# Patient Record
Sex: Female | Born: 1935 | Race: White | Hispanic: No | Smoking: Never smoker
Health system: Southern US, Community
[De-identification: ages and names within clinical notes are randomized; demographics above are authoritative.]

## PROBLEM LIST (undated history)

## (undated) ENCOUNTER — Emergency Department (HOSPITAL_BASED_OUTPATIENT_CLINIC_OR_DEPARTMENT_OTHER): Discharge status: Against Medical Advice | Payer: BC Managed Care – PPO

## (undated) DIAGNOSIS — K219 Gastro-esophageal reflux disease without esophagitis: Secondary | ICD-10-CM

## (undated) DIAGNOSIS — H269 Unspecified cataract: Secondary | ICD-10-CM

## (undated) DIAGNOSIS — I341 Nonrheumatic mitral (valve) prolapse: Secondary | ICD-10-CM

## (undated) DIAGNOSIS — J479 Bronchiectasis, uncomplicated: Secondary | ICD-10-CM

## (undated) DIAGNOSIS — N12 Tubulo-interstitial nephritis, not specified as acute or chronic: Secondary | ICD-10-CM

## (undated) DIAGNOSIS — I10 Essential (primary) hypertension: Secondary | ICD-10-CM

## (undated) DIAGNOSIS — R7611 Nonspecific reaction to tuberculin skin test without active tuberculosis: Secondary | ICD-10-CM

## (undated) DIAGNOSIS — E079 Disorder of thyroid, unspecified: Secondary | ICD-10-CM

## (undated) DIAGNOSIS — M5090 Cervical disc disorder, unspecified, unspecified cervical region: Secondary | ICD-10-CM

## (undated) HISTORY — PX: PARTIAL HYSTERECTOMY: SHX80

## (undated) HISTORY — PX: TONSILLECTOMY AND ADENOIDECTOMY: SUR1326

## (undated) HISTORY — DX: Essential (primary) hypertension: I10

## (undated) HISTORY — DX: Disorder of thyroid, unspecified: E07.9

## (undated) HISTORY — DX: Nonspecific reaction to tuberculin skin test without active tuberculosis: R76.11

## (undated) HISTORY — PX: EYE SURGERY: SHX253

## (undated) HISTORY — PX: CHOLECYSTECTOMY: SHX55

## (undated) HISTORY — DX: Nonrheumatic mitral (valve) prolapse: I34.1

## (undated) HISTORY — PX: OTHER SURGICAL HISTORY: SHX169

## (undated) HISTORY — DX: Unspecified cataract: H26.9

## (undated) HISTORY — PX: ABDOMINAL HYSTERECTOMY: SHX81

## (undated) HISTORY — DX: Gastro-esophageal reflux disease without esophagitis: K21.9

## (undated) HISTORY — DX: Cervical disc disorder, unspecified, unspecified cervical region: M50.90

## (undated) HISTORY — DX: Bronchiectasis, uncomplicated: J47.9

## (undated) HISTORY — DX: Tubulo-interstitial nephritis, not specified as acute or chronic: N12

---

## 1999-02-25 LAB — HM PAP SMEAR: HM Pap smear: NORMAL

## 1999-10-26 ENCOUNTER — Ambulatory Visit (HOSPITAL_COMMUNITY): Admission: RE | Admit: 1999-10-26 | Discharge: 1999-10-26 | Payer: Self-pay | Admitting: Specialist

## 2000-06-01 ENCOUNTER — Ambulatory Visit (HOSPITAL_COMMUNITY): Admission: RE | Admit: 2000-06-01 | Discharge: 2000-06-01 | Payer: Self-pay | Admitting: Specialist

## 2000-09-19 ENCOUNTER — Encounter: Admission: RE | Admit: 2000-09-19 | Discharge: 2000-09-19 | Payer: Self-pay | Admitting: Family Medicine

## 2000-09-19 ENCOUNTER — Encounter: Payer: Self-pay | Admitting: Family Medicine

## 2001-09-26 ENCOUNTER — Encounter: Payer: Self-pay | Admitting: Family Medicine

## 2001-09-26 ENCOUNTER — Encounter: Admission: RE | Admit: 2001-09-26 | Discharge: 2001-09-26 | Payer: Self-pay | Admitting: Family Medicine

## 2001-11-08 ENCOUNTER — Emergency Department (HOSPITAL_COMMUNITY): Admission: EM | Admit: 2001-11-08 | Discharge: 2001-11-08 | Payer: Self-pay

## 2001-11-08 ENCOUNTER — Encounter: Payer: Self-pay | Admitting: Emergency Medicine

## 2002-11-14 ENCOUNTER — Encounter: Payer: Self-pay | Admitting: Family Medicine

## 2002-11-14 ENCOUNTER — Encounter: Admission: RE | Admit: 2002-11-14 | Discharge: 2002-11-14 | Payer: Self-pay | Admitting: Family Medicine

## 2003-08-07 ENCOUNTER — Ambulatory Visit (HOSPITAL_COMMUNITY): Admission: RE | Admit: 2003-08-07 | Discharge: 2003-08-07 | Payer: Self-pay | Admitting: Gastroenterology

## 2003-08-07 ENCOUNTER — Encounter (INDEPENDENT_AMBULATORY_CARE_PROVIDER_SITE_OTHER): Payer: Self-pay | Admitting: Specialist

## 2004-03-24 ENCOUNTER — Encounter: Admission: RE | Admit: 2004-03-24 | Discharge: 2004-03-24 | Payer: Self-pay | Admitting: Emergency Medicine

## 2005-02-01 ENCOUNTER — Emergency Department (HOSPITAL_COMMUNITY): Admission: EM | Admit: 2005-02-01 | Discharge: 2005-02-01 | Payer: Self-pay | Admitting: Emergency Medicine

## 2005-04-07 ENCOUNTER — Encounter: Admission: RE | Admit: 2005-04-07 | Discharge: 2005-04-07 | Payer: Self-pay | Admitting: Emergency Medicine

## 2006-01-09 ENCOUNTER — Ambulatory Visit (HOSPITAL_COMMUNITY): Admission: RE | Admit: 2006-01-09 | Discharge: 2006-01-09 | Payer: Self-pay | Admitting: Emergency Medicine

## 2006-04-14 ENCOUNTER — Encounter: Admission: RE | Admit: 2006-04-14 | Discharge: 2006-04-14 | Payer: Self-pay | Admitting: Emergency Medicine

## 2007-04-17 ENCOUNTER — Encounter: Admission: RE | Admit: 2007-04-17 | Discharge: 2007-04-17 | Payer: Self-pay | Admitting: Emergency Medicine

## 2008-01-05 ENCOUNTER — Encounter: Admission: RE | Admit: 2008-01-05 | Discharge: 2008-01-05 | Payer: Self-pay | Admitting: Emergency Medicine

## 2008-01-08 ENCOUNTER — Ambulatory Visit: Payer: Self-pay | Admitting: Vascular Surgery

## 2008-05-14 ENCOUNTER — Encounter: Admission: RE | Admit: 2008-05-14 | Discharge: 2008-05-14 | Payer: Self-pay | Admitting: Emergency Medicine

## 2011-03-17 ENCOUNTER — Other Ambulatory Visit: Payer: Self-pay | Admitting: Emergency Medicine

## 2011-03-30 ENCOUNTER — Ambulatory Visit
Admission: RE | Admit: 2011-03-30 | Discharge: 2011-03-30 | Disposition: A | Discharge status: Home / Self Care | Payer: Medicare Other | Source: Ambulatory Visit | Attending: Emergency Medicine | Admitting: Emergency Medicine

## 2011-05-11 NOTE — Procedures (Signed)
CAROTID DUPLEX EXAM   INDICATION:  Bilateral arm numbness.   HISTORY:  Diabetes:  No.  Cardiac:  No.  Hypertension:  Yes.  Smoking:  No.  Previous Surgery:  No.  CV History:  No.  Amaurosis Fugax:  No, Paresthesias:  No, Hemiparesis:  No                                       RIGHT             LEFT  Brachial systolic pressure:         121               120  Brachial Doppler waveforms:         Biphasic          Biphasic  Vertebral direction of flow:        Antegrade         Antegrade  DUPLEX VELOCITIES (cm/sec)  CCA peak systolic                   67                38  ECA peak systolic                   114               81  ICA peak systolic                   84                91  ICA end diastolic                   27                26  PLAQUE MORPHOLOGY:                  Heterogeneous     Heterogeneous  PLAQUE AMOUNT:                      Mild              Mild  PLAQUE LOCATION:                    ICA and ECA       ICA and ECA   IMPRESSION:  1% to 39% stenosis noted in bilateral internal carotid  arteries bilaterally.  Antegrade bilateral vertebral arteries.   ___________________________________________  Janetta Hora Fields, MD   MG/MEDQ  D:  01/08/2008  T:  01/09/2008  Job:  [id number]

## 2011-05-14 NOTE — Op Note (Signed)
[place]. [facility]  Patient:    Shelly Morse, Shelly Morse                     MRN: [id number] Proc. Date: 06/22/00 Adm. Date:  [id number] Disc. Date: [id number] Attending:  Mick Sell                           Operative Report  PREOPERATIVE DIAGNOSIS:  Cataract, right eye.  POSTOPERATIVE DIAGNOSIS:  Cataract, right eye.  OPERATION PERFORMED:  Cataract extraction with intraocular lens implant, right eye.  SURGEON:  Chucky May, M.D.  INDICATIONS FOR SURGERY:  The patient is a 77 year old female with painless progressive decrease in vision so that she has difficulty seeing for her day to day activities, especially reading.  On examination the patient was found to have a dense nuclear sclerotic and cortical cataract consistent with the decrease in visual acuity.  DESCRIPTION OF PROCEDURE:  The patient was brought to the main operating room and placed in supine position.  Anesthesia was obtained by means of topical 4% lidocaine drops with tetracaine.  The patient was then prepped and draped in the usual manner.  A lid speculum was inserted and the cornea was entered with a diamond keratome superiorly with an additional port temporally.  OcuCoat was instilled and an anterior capsulorrhexis was performed without difficulty. The nucleus was mobilized by hydrodissection followed by phacoemulsification of the nucleus and removal of residual cortical material by irrigation and aspiration.  The posterior capsule was polished and a posterior chamber lens implant was placed in the bag without difficulty.  OcuCoat was removed and replaced with balanced salt solution.  The wound was hydrated with balanced salt solution and checked for fluid leaks but none were noted.  The eye was dressed with topical Pred Forte, Ocuflox, Voltaren and a Fox shield and the patient was taken to the recovery room in excellent condition where she received written and verbal instructions  for her postoperative care and was scheduled for follow up in 24 hours. DD:  06/22/00 TD:  06/23/00 Job: [id number]

## 2011-05-14 NOTE — Op Note (Signed)
   NAME:  Shelly Morse, Shelly Morse                        ACCOUNT NO.:  [id number]   MEDICAL RECORD NO.:  [id number]                   PATIENT TYPE:  AMB   LOCATION:  ENDO                                 FACILITY:  [facility]   PHYSICIAN:  Graylin Shiver, M.D.                DATE OF BIRTH:  May 18, 1922   DATE OF PROCEDURE:  08/07/2003  DATE OF DISCHARGE:                                 OPERATIVE REPORT   PROCEDURE:  Colonoscopy with polypectomy.   INDICATION FOR PROCEDURE:  Screening.  Family history of colon cancer in the  patient's half-brother, intermittent rectal bleeding.   Informed consent was obtained after explanation of the risks of bleeding,  infection, and perforation.   PREMEDICATIONS:  1. Fentanyl 37.5 mcg IV.  2. Versed 4 mg IV.   DESCRIPTION OF PROCEDURE:  With the patient in the left lateral decubitus  position, a rectal exam was performed, and no masses were felt.  The Olympus  colonoscope was inserted into the rectum and advanced around the colon to  the cecum.  The colon was quite tortuous up around the region of the hepatic  flexure, requiring abdominal pressure and changing of the patient's  position.  The cecal landmarks were identified.  The cecum looked normal.  In the proximal ascending colon, there was a 5 mm sessile polyps which was  snared with the mini-snare and removed by snare cautery technique.  The  polyp was retrieved.  The transverse colon looked normal.  The descending  colon, sigmoid, and rectum looked normal.  She tolerated the procedure well  without complications.   IMPRESSION:  Small polyp in the ascending colon.   PLAN:  The pathology will be checked.                                               Graylin Shiver, M.D.    SFG/MEDQ  D:  08/07/2003  T:  08/07/2003  Job:  [id number]   cc:   Brett Canales A. Cleta Alberts, M.D.  [address]  [place]  [place] [address]  Fax: [phone]

## 2011-06-27 DIAGNOSIS — N12 Tubulo-interstitial nephritis, not specified as acute or chronic: Secondary | ICD-10-CM

## 2011-06-27 HISTORY — DX: Tubulo-interstitial nephritis, not specified as acute or chronic: N12

## 2011-07-12 LAB — HEPATIC FUNCTION PANEL
ALT: 26 U/L (ref 7–35)
AST: 35 U/L (ref 13–35)
Bilirubin, Total: 1.3 mg/dL

## 2011-07-12 LAB — BASIC METABOLIC PANEL
BUN: 21 mg/dL (ref 4–21)
Creatinine: 0.9 mg/dL (ref <=1.1)
Glucose: 111 mg/dL
Potassium: 4.3 mmol/L (ref 3.4–5.3)

## 2012-01-10 ENCOUNTER — Encounter: Payer: Self-pay | Admitting: *Deleted

## 2012-01-10 DIAGNOSIS — K219 Gastro-esophageal reflux disease without esophagitis: Secondary | ICD-10-CM | POA: Insufficient documentation

## 2012-01-10 DIAGNOSIS — I10 Essential (primary) hypertension: Secondary | ICD-10-CM | POA: Insufficient documentation

## 2012-02-01 ENCOUNTER — Encounter: Payer: Self-pay | Admitting: Emergency Medicine

## 2012-02-01 ENCOUNTER — Ambulatory Visit (INDEPENDENT_AMBULATORY_CARE_PROVIDER_SITE_OTHER): Payer: Medicare Other | Admitting: Emergency Medicine

## 2012-02-01 VITALS — BP 138/71 | HR 66 | Temp 97.6°F | Resp 20 | Ht 64.0 in | Wt 139.8 lb

## 2012-02-01 DIAGNOSIS — I10 Essential (primary) hypertension: Secondary | ICD-10-CM

## 2012-02-01 DIAGNOSIS — Z0000 Encounter for general adult medical examination without abnormal findings: Secondary | ICD-10-CM

## 2012-02-01 NOTE — Progress Notes (Signed)
  Subjective:    Patient ID: Shelly Morse, female    DOB: 03-Oct-1922, 78 y.o.   MRN: [id number]  HPI patient enters for general checkup she is actually doing very well she is concerned because her systolic pressure is high but her diastolic is low. She denies any orthostatic symptoms chest pain or shortness of breath she states she's not and should have a mammogram colonoscopy or other invasive diagnostic    Review of Systems no real complaints she is primarily concerned about blood pressure readings.     Objective:   Physical Exam HE EENT is normal chest is clear heart is regular rate and rhythm abdomen soft nontender        Assessment & Plan:  Patient is doing well she is currently on Diovan and has well-controlled blood pressure no change in her treatment plan at present

## 2012-02-01 NOTE — Patient Instructions (Signed)
Plantar Fasciitis (Heel Spur Syndrome) with Rehab The plantar fascia is a fibrous, ligament-like, soft-tissue structure that spans the bottom of the foot. Plantar fasciitis is a condition that causes pain in the foot due to inflammation of the tissue. SYMPTOMS    Pain and tenderness on the underneath side of the foot.     Pain that worsens with standing or walking.  CAUSES   Plantar fasciitis is caused by irritation and injury to the plantar fascia on the underneath side of the foot. Common mechanisms of injury include:  Direct trauma to bottom of the foot.     Damage to a small nerve that runs under the foot where the main fascia attaches to the heel bone.     Stress placed on the plantar fascia due to bone spurs.  RISK INCREASES WITH:    Activities that place stress on the plantar fascia (running, jumping, pivoting, or cutting).     Poor strength and flexibility.     Improperly fitted shoes.     Tight calf muscles.     Flat feet.     Failure to warm-up properly before activity.     Obesity.  PREVENTION  Warm up and stretch properly before activity.     Allow for adequate recovery between workouts.     Maintain physical fitness:     Strength, flexibility, and endurance.     Cardiovascular fitness.     Maintain a health body weight.     Avoid stress on the plantar fascia.     Wear properly fitted shoes, including arch supports for individuals who have flat feet.  PROGNOSIS   If treated properly, then the symptoms of plantar fasciitis usually resolve without surgery. However, occasionally surgery is necessary. RELATED COMPLICATIONS    Recurrent symptoms that may result in a chronic condition.     Problems of the lower back that are caused by compensating for the injury, such as limping.     Pain or weakness of the foot during push-off following surgery.     Chronic inflammation, scarring, and partial or complete fascia tear, occurring more often from repeated  injections.  TREATMENT   Treatment initially involves the use of ice and medication to help reduce pain and inflammation. The use of strengthening and stretching exercises may help reduce pain with activity, especially stretches of the Achilles tendon. These exercises may be performed at home or with a therapist. Your caregiver may recommend that you use heel cups of arch supports to help reduce stress on the plantar fascia. Occasionally, corticosteroid injections are given to reduce inflammation. If symptoms persist for greater than 6 months despite non-surgical (conservative), then surgery may be recommended.   MEDICATION    If pain medication is necessary, then nonsteroidal anti-inflammatory medications, such as aspirin and ibuprofen, or other minor pain relievers, such as acetaminophen, are often recommended.     Do not take pain medication within 7 days before surgery.     Prescription pain relievers may be given if deemed necessary by your caregiver. Use only as directed and only as much as you need.     Corticosteroid injections may be given by your caregiver. These injections should be reserved for the most serious cases, because they may only be given a certain number of times.  HEAT AND COLD  Cold treatment (icing) relieves pain and reduces inflammation. Cold treatment should be applied for 10 to 15 minutes every 2 to 3 hours for inflammation and pain and immediately   after any activity that aggravates your symptoms. Use ice packs or massage the area with a piece of ice (ice massage).     Heat treatment may be used prior to performing the stretching and strengthening activities prescribed by your caregiver, physical therapist, or athletic trainer. Use a heat pack or soak the injury in warm water.  SEEK IMMEDIATE MEDICAL CARE IF:  Treatment seems to offer no benefit, or the condition worsens.     Any medications produce adverse side effects.  EXERCISES RANGE OF MOTION (ROM) AND  STRETCHING EXERCISES - Plantar Fasciitis (Heel Spur Syndrome) These exercises may help you when beginning to rehabilitate your injury. Your symptoms may resolve with or without further involvement from your physician, physical therapist or athletic trainer. While completing these exercises, remember:    Restoring tissue flexibility helps normal motion to return to the joints. This allows healthier, less painful movement and activity.     An effective stretch should be held for at least 30 seconds.     A stretch should never be painful. You should only feel a gentle lengthening or release in the stretched tissue.  RANGE OF MOTION - Toe Extension, Flexion  Sit with your right / left leg crossed over your opposite knee.     Grasp your toes and gently pull them back toward the top of your foot. You should feel a stretch on the bottom of your toes and/or foot.     Hold this stretch for __________ seconds.     Now, gently pull your toes toward the bottom of your foot. You should feel a stretch on the top of your toes and or foot.     Hold this stretch for __________ seconds.  Repeat __________ times. Complete this stretch __________ times per day.   RANGE OF MOTION - Ankle Dorsiflexion, Active Assisted  Remove shoes and sit on a chair that is preferably not on a carpeted surface.     Place right / left foot under knee. Extend your opposite leg for support.     Keeping your heel down, slide your right / left foot back toward the chair until you feel a stretch at your ankle or calf. If you do not feel a stretch, slide your bottom forward to the edge of the chair, while still keeping your heel down.     Hold this stretch for __________ seconds.  Repeat __________ times. Complete this stretch __________ times per day.   STRETCH - Gastroc, Standing  Place hands on wall.     Extend right / left leg, keeping the front knee somewhat bent.     Slightly point your toes inward on your back foot.      Keeping your right / left heel on the floor and your knee straight, shift your weight toward the wall, not allowing your back to arch.     You should feel a gentle stretch in the right / left calf. Hold this position for __________ seconds.  Repeat __________ times. Complete this stretch __________ times per day. STRETCH - Soleus, Standing  Place hands on wall.     Extend right / left leg, keeping the other knee somewhat bent.     Slightly point your toes inward on your back foot.     Keep your right / left heel on the floor, bend your back knee, and slightly shift your weight over the back leg so that you feel a gentle stretch deep in your back calf.       Hold this position for __________ seconds.  Repeat __________ times. Complete this stretch __________ times per day. STRETCH - Gastrocsoleus, Standing  Note: This exercise can place a lot of stress on your foot and ankle. Please complete this exercise only if specifically instructed by your caregiver.    Place the ball of your right / left foot on a step, keeping your other foot firmly on the same step.     Hold on to the wall or a rail for balance.     Slowly lift your other foot, allowing your body weight to press your heel down over the edge of the step.     You should feel a stretch in your right / left calf.     Hold this position for __________ seconds.     Repeat this exercise with a slight bend in your right / left knee.  Repeat __________ times. Complete this stretch __________ times per day.   STRENGTHENING EXERCISES - Plantar Fasciitis (Heel Spur Syndrome)  These exercises may help you when beginning to rehabilitate your injury. They may resolve your symptoms with or without further involvement from your physician, physical therapist or athletic trainer. While completing these exercises, remember:    Muscles can gain both the endurance and the strength needed for everyday activities through controlled exercises.       Complete these exercises as instructed by your physician, physical therapist or athletic trainer. Progress the resistance and repetitions only as guided.  STRENGTH - Towel Curls  Sit in a chair positioned on a non-carpeted surface.     Place your foot on a towel, keeping your heel on the floor.     Pull the towel toward your heel by only curling your toes. Keep your heel on the floor.     If instructed by your physician, physical therapist or athletic trainer, add ____________________ at the end of the towel.  Repeat __________ times. Complete this exercise __________ times per day. STRENGTH - Ankle Inversion  Secure one end of a rubber exercise band/tubing to a fixed object (table, pole). Loop the other end around your foot just before your toes.     Place your fists between your knees. This will focus your strengthening at your ankle.     Slowly, pull your big toe up and in, making sure the band/tubing is positioned to resist the entire motion.     Hold this position for __________ seconds.     Have your muscles resist the band/tubing as it slowly pulls your foot back to the starting position.  Repeat __________ times. Complete this exercises __________ times per day.   Document Released: 12/13/2005 Document Revised: 08/25/2011 Document Reviewed: 03/27/2009 [employer] Patient Information 2012 [employer]. 

## 2012-02-02 LAB — POCT URINALYSIS DIPSTICK
Bilirubin, UA: NEGATIVE
Glucose, UA: NEGATIVE
Ketones, UA: NEGATIVE
Nitrite, UA: NEGATIVE
Urobilinogen, UA: 0.2

## 2012-02-02 LAB — CBC WITH DIFFERENTIAL/PLATELET
Basophils Absolute: 0 10*3/uL (ref 0.0–0.1)
Lymphocytes Relative: 27 % (ref 12–46)
Lymphs Abs: 2.7 10*3/uL (ref 0.7–4.0)
MCH: 32.5 pg (ref 26.0–34.0)
MCHC: 32.7 g/dL (ref 30.0–36.0)
Monocytes Absolute: 0.5 10*3/uL (ref 0.1–1.0)
Neutrophils Relative %: 68 % (ref 43–77)
WBC: 10 10*3/uL (ref 4.0–10.5)

## 2012-02-02 LAB — COMPREHENSIVE METABOLIC PANEL
AST: 29 U/L (ref 0–37)
Glucose, Bld: 81 mg/dL (ref 70–99)
Potassium: 4.3 mEq/L (ref 3.5–5.3)

## 2012-02-02 NOTE — Progress Notes (Signed)
Addended by: Lesle Chris A on: 02/02/2012 10:44 AM   Modules accepted: Orders

## 2012-02-07 ENCOUNTER — Telehealth: Payer: Self-pay

## 2012-02-07 NOTE — Telephone Encounter (Signed)
.  [facility] PT HAD LABS DONE AND WAS TOLD SHE WOULD GET A CALL. PLEASE CALL [phone]

## 2012-02-08 NOTE — Telephone Encounter (Signed)
Please call Shelly Morse and let her now her labs are okay. Her TSH is slightly elevated but still within normal range. We will recheck it again in about 6 months. Please mail her a copy of her labs.

## 2012-02-08 NOTE — Telephone Encounter (Signed)
SPOKE WITH PT GAVE RESULTS FROM DR DAUB. LABS MAILED TO PT

## 2012-02-15 ENCOUNTER — Ambulatory Visit (INDEPENDENT_AMBULATORY_CARE_PROVIDER_SITE_OTHER): Payer: Medicare Other | Admitting: Internal Medicine

## 2012-02-15 VITALS — BP 160/76 | HR 76 | Temp 97.7°F | Resp 18 | Ht 63.0 in | Wt 143.0 lb

## 2012-02-15 DIAGNOSIS — H1010 Acute atopic conjunctivitis, unspecified eye: Secondary | ICD-10-CM

## 2012-02-15 DIAGNOSIS — H10219 Acute toxic conjunctivitis, unspecified eye: Secondary | ICD-10-CM

## 2012-02-15 DIAGNOSIS — Z77098 Contact with and (suspected) exposure to other hazardous, chiefly nonmedicinal, chemicals: Secondary | ICD-10-CM

## 2012-02-15 NOTE — Progress Notes (Signed)
  Subjective:    Patient ID: Shelly Morse, female    DOB: Apr 01, 1922, 78 y.o.   MRN: [id number]  HPI used [employer] to clean under kitchen cabinet on 02/04/12 and having persistent scratchy throat, irritated eyes, feels thirsty and a little foggy eaded. Used protective clothing eye goggles, latex gloves, work Cytogeneticist.cleaned under the dishwasher and now using the dishwasher and still smells the product and feels irritation of her mucous membranes. No cough no sob not dizzy    Review of Systems  Constitutional: Negative.   HENT: Negative.   Eyes: Positive for itching.  Respiratory: Negative.   Cardiovascular: Negative.   Gastrointestinal: Negative.   Genitourinary: Negative.   Musculoskeletal: Negative.   Neurological: Positive for light-headedness.  Hematological: Negative.   Psychiatric/Behavioral: Negative.   All other systems reviewed and are negative.       Objective:   Physical Exam  Constitutional: She is oriented to person, place, and time. She appears well-developed and well-nourished.  HENT:  Head: Normocephalic and atraumatic.  Eyes: Conjunctivae and EOM are normal. Pupils are equal, round, and reactive to light.  Neck: Normal range of motion. Neck supple.  Cardiovascular: Normal rate and regular rhythm.   Pulmonary/Chest: Effort normal and breath sounds normal.  Abdominal: Soft.  Musculoskeletal: Normal range of motion.  Neurological: She is alert and oriented to person, place, and time.  Skin: Skin is warm and dry.  Psychiatric: She has a normal mood and affect. Her behavior is normal. Judgment and thought content normal.          Assessment & Plan:  chemical exposure Chemical sensitivity

## 2012-02-15 NOTE — Progress Notes (Signed)
  Subjective:    Patient ID: Shelly Morse, female    DOB: August 22, 1922, 78 y.o.   MRN: [id number]  HPI    Review of Systems     Objective:   Physical Exam        Assessment & Plan:  o2 sat is normal

## 2012-02-15 NOTE — Patient Instructions (Signed)
Increase fluids. Increase aeration of the environment. If your symptoms worsen return to have further evaluation.

## 2012-02-27 ENCOUNTER — Other Ambulatory Visit: Payer: Self-pay | Admitting: Emergency Medicine

## 2012-04-16 ENCOUNTER — Other Ambulatory Visit: Payer: Self-pay | Admitting: Physician Assistant

## 2012-06-21 ENCOUNTER — Other Ambulatory Visit: Payer: Self-pay | Admitting: Internal Medicine

## 2012-07-21 ENCOUNTER — Other Ambulatory Visit: Payer: Self-pay | Admitting: Physician Assistant

## 2012-09-14 ENCOUNTER — Other Ambulatory Visit: Payer: Self-pay | Admitting: Physician Assistant

## 2012-10-02 ENCOUNTER — Other Ambulatory Visit: Payer: Self-pay | Admitting: Physician Assistant

## 2012-11-09 ENCOUNTER — Telehealth: Payer: Self-pay

## 2012-11-09 NOTE — Telephone Encounter (Signed)
Spoke with pt, she thinks it is a side effect from her medication. She is on Diovan but wants to take something with a lower dose and generic. Can you advise Dr Cleta Alberts? She would like you to advise on this. She really would like a generic because her money is limited.

## 2012-11-09 NOTE — Telephone Encounter (Signed)
Pt says she is having terrible leg cramps and would like a nurse to call her as soon as possible [phone]

## 2012-11-09 NOTE — Telephone Encounter (Signed)
Called her , last OV was Feb she needs to come in for legs cramping.

## 2012-11-10 MED ORDER — LOSARTAN POTASSIUM 50 MG PO TABS: 50.0000 mg | ORAL_TABLET | Freq: Every day | ORAL | Status: DC

## 2012-11-10 NOTE — Telephone Encounter (Signed)
Called patient advised new Rx sent to [facility] [address]. Patient will call back and let us know what her BP readings are.

## 2012-11-10 NOTE — Telephone Encounter (Signed)
Please call patient after she's been checking her blood pressures at home. If she has been checking her blood pressure and wants to a less expensive drug we can change her from Diovan to Cozaar 50 mg one a day #30 refill times 5.

## 2013-01-20 ENCOUNTER — Other Ambulatory Visit: Payer: Self-pay | Admitting: Physician Assistant

## 2013-01-25 ENCOUNTER — Other Ambulatory Visit: Payer: Self-pay | Admitting: Physician Assistant

## 2013-02-02 ENCOUNTER — Other Ambulatory Visit: Payer: Self-pay | Admitting: Emergency Medicine

## 2013-02-02 ENCOUNTER — Ambulatory Visit (INDEPENDENT_AMBULATORY_CARE_PROVIDER_SITE_OTHER): Payer: Medicare Other | Admitting: Emergency Medicine

## 2013-02-02 ENCOUNTER — Telehealth: Payer: Self-pay

## 2013-02-02 VITALS — BP 170/80 | HR 88 | Temp 97.9°F | Resp 18 | Wt 140.0 lb

## 2013-02-02 DIAGNOSIS — I10 Essential (primary) hypertension: Secondary | ICD-10-CM

## 2013-02-02 DIAGNOSIS — Z780 Asymptomatic menopausal state: Secondary | ICD-10-CM

## 2013-02-02 LAB — POCT UA - MICROSCOPIC ONLY
Amorphous, UA: POSITIVE
Crystals, Ur, HPF, POC: NEGATIVE
WBC, Ur, HPF, POC: NEGATIVE
Yeast, UA: NEGATIVE

## 2013-02-02 LAB — POCT CBC
HCT, POC: 46.2 % (ref 37.7–47.9)
Lymph, poc: 3.4 (ref 0.6–3.4)
MCH, POC: 31.9 pg — AB (ref 27–31.2)
MCHC: 31.6 g/dL — AB (ref 31.8–35.4)
MID (cbc): 0.7 (ref 0–0.9)
POC MID %: 6.5 %M (ref 0–12)
RDW, POC: 15.2 %
WBC: 11.5 10*3/uL — AB (ref 4.6–10.2)

## 2013-02-02 LAB — POCT URINALYSIS DIPSTICK: Blood, UA: NEGATIVE

## 2013-02-02 MED ORDER — ESTROGENS CONJUGATED 0.3 MG PO TABS: ORAL_TABLET | ORAL | Status: DC

## 2013-02-02 MED ORDER — LOSARTAN POTASSIUM 50 MG PO TABS: 50.0000 mg | ORAL_TABLET | Freq: Every day | ORAL | Status: DC

## 2013-02-02 NOTE — Progress Notes (Signed)
  Subjective:    Patient ID: Shelly Morse, female    DOB: Jul 21, 1922, 79 y.o.   MRN: [id number]  HPI Patient here today to check up on hypertension. She needs medication refills also. She denies chest pain shortness of breath. She states she has decreased her Premarin to twice a week. She understands the risks of taking Premarin at her age and she is willing to accept those. She refuses to get a colonoscopy or mammogram. She overall is doing well she's been eating well and continues on her blood pressure medication.    Review of Systems     Objective:   Physical Exam patient is alert and cooperative in no distress. Her neck is supple. I did not hear any carotid bruits. Chest was clear to both auscultation and percussion. Heart regular rate no murmurs or gallops appreciated breasts without masses. The abdomen is soft liver spleen not enlarged. No masses felt. Extremities without edema.        Assessment & Plan:  Routine labs to be done today. She does understand the risks of taking Premarin at her age. She is willing to accept the risks of heart attack, and  breast cancer.

## 2013-02-02 NOTE — Telephone Encounter (Signed)
Pt is distraught to have the ammino acid listed as things she is taking.  Please call  [phone]

## 2013-02-02 NOTE — Telephone Encounter (Signed)
Patient indeed was very distraught over this, we were trying to document her Magnesium suppliment she is taking/ I have corrected this for her. She is also questioning the future order for her pulse oximetry discussed with Dr Cleta Alberts and she does not need to do anything with this, she should keep an eye on her blood pressure and let us know how they are running. Shelly Morse

## 2013-02-03 LAB — COMPREHENSIVE METABOLIC PANEL
AST: 27 U/L (ref 0–37)
Alkaline Phosphatase: 75 U/L (ref 39–117)
BUN: 21 mg/dL (ref 6–23)
Chloride: 101 mEq/L (ref 96–112)
Total Bilirubin: 0.8 mg/dL (ref 0.3–1.2)

## 2013-02-04 LAB — T4, FREE: Free T4: 0.99 ng/dL (ref 0.80–1.80)

## 2013-03-19 ENCOUNTER — Ambulatory Visit (INDEPENDENT_AMBULATORY_CARE_PROVIDER_SITE_OTHER): Payer: Medicare Other | Admitting: Family Medicine

## 2013-03-19 VITALS — BP 150/60 | HR 68 | Temp 98.2°F | Resp 16

## 2013-03-19 DIAGNOSIS — J069 Acute upper respiratory infection, unspecified: Secondary | ICD-10-CM

## 2013-03-19 NOTE — Patient Instructions (Addendum)
I believe that you have a mild upper respiratory illness that should clear without antibiotics.  If you are not improving in 2-3 days, let us know.   If your cough gets worse or you develop fever or shortness of breath, come right in

## 2013-03-19 NOTE — Progress Notes (Signed)
79 yo woman with h/o pneumonia who has developed fatigue and soreness in her posterior thoracic spine, bilaterally.   Onset was about 4 days ago.  Some coughing with clear mucous.  No shortness of breath.  Pain is worsened with deep breath.  Appetite is good, no nausea No chills, but she has had some sweats (she wonders if these are hot flashes as she is weaning from Premarin)  Objective:  NAD  Chest:  Clear, nontender with palpation Heart:  Regular, I/VI systolic murmur Neck:  Supple, no adenopathy  Assessment:  Mild symptoms which are most consistent with URI.  No antibiotics warranted at this point.  Plan:  I've asked patient to return with worsening sx or failure to improve in 3 days.  Signed,  Elvina Sidle, MD

## 2013-06-10 ENCOUNTER — Other Ambulatory Visit: Payer: Self-pay | Admitting: Emergency Medicine

## 2013-06-22 ENCOUNTER — Other Ambulatory Visit: Payer: Self-pay | Admitting: Emergency Medicine

## 2013-07-19 ENCOUNTER — Other Ambulatory Visit: Payer: Self-pay | Admitting: Emergency Medicine

## 2013-09-02 ENCOUNTER — Other Ambulatory Visit: Payer: Self-pay

## 2013-09-02 ENCOUNTER — Other Ambulatory Visit: Payer: Self-pay | Admitting: Emergency Medicine

## 2013-09-02 ENCOUNTER — Other Ambulatory Visit (INDEPENDENT_AMBULATORY_CARE_PROVIDER_SITE_OTHER): Payer: Medicare Other | Admitting: Family Medicine

## 2013-09-02 DIAGNOSIS — R7989 Other specified abnormal findings of blood chemistry: Secondary | ICD-10-CM

## 2013-09-02 DIAGNOSIS — R6889 Other general symptoms and signs: Secondary | ICD-10-CM

## 2013-09-02 LAB — TSH: TSH: 4.531 u[IU]/mL — ABNORMAL HIGH (ref 0.350–4.500)

## 2013-09-03 LAB — T4, FREE: Free T4: 0.87 ng/dL (ref 0.80–1.80)

## 2013-09-21 ENCOUNTER — Other Ambulatory Visit: Payer: Self-pay | Admitting: Physician Assistant

## 2013-11-01 ENCOUNTER — Emergency Department (HOSPITAL_COMMUNITY): Payer: Medicare Other

## 2013-11-01 ENCOUNTER — Emergency Department (HOSPITAL_COMMUNITY)
Admission: EM | Admit: 2013-11-01 | Discharge: 2013-11-01 | Disposition: A | Discharge status: Home / Self Care | Payer: Medicare Other | Attending: Emergency Medicine | Admitting: Emergency Medicine

## 2013-11-01 DIAGNOSIS — S42201A Unspecified fracture of upper end of right humerus, initial encounter for closed fracture: Secondary | ICD-10-CM

## 2013-11-01 DIAGNOSIS — Z8739 Personal history of other diseases of the musculoskeletal system and connective tissue: Secondary | ICD-10-CM | POA: Insufficient documentation

## 2013-11-01 DIAGNOSIS — Z8719 Personal history of other diseases of the digestive system: Secondary | ICD-10-CM | POA: Insufficient documentation

## 2013-11-01 DIAGNOSIS — Y929 Unspecified place or not applicable: Secondary | ICD-10-CM | POA: Insufficient documentation

## 2013-11-01 DIAGNOSIS — S0003XA Contusion of scalp, initial encounter: Secondary | ICD-10-CM | POA: Insufficient documentation

## 2013-11-01 DIAGNOSIS — I10 Essential (primary) hypertension: Secondary | ICD-10-CM | POA: Insufficient documentation

## 2013-11-01 DIAGNOSIS — Y9301 Activity, walking, marching and hiking: Secondary | ICD-10-CM | POA: Insufficient documentation

## 2013-11-01 DIAGNOSIS — R296 Repeated falls: Secondary | ICD-10-CM | POA: Insufficient documentation

## 2013-11-01 DIAGNOSIS — S42209A Unspecified fracture of upper end of unspecified humerus, initial encounter for closed fracture: Secondary | ICD-10-CM | POA: Insufficient documentation

## 2013-11-01 DIAGNOSIS — IMO0002 Reserved for concepts with insufficient information to code with codable children: Secondary | ICD-10-CM | POA: Insufficient documentation

## 2013-11-01 DIAGNOSIS — S0510XA Contusion of eyeball and orbital tissues, unspecified eye, initial encounter: Secondary | ICD-10-CM | POA: Insufficient documentation

## 2013-11-01 DIAGNOSIS — Z8742 Personal history of other diseases of the female genital tract: Secondary | ICD-10-CM | POA: Insufficient documentation

## 2013-11-01 DIAGNOSIS — Z91040 Latex allergy status: Secondary | ICD-10-CM | POA: Insufficient documentation

## 2013-11-01 DIAGNOSIS — Z79899 Other long term (current) drug therapy: Secondary | ICD-10-CM | POA: Insufficient documentation

## 2013-11-01 MED ORDER — HYDROCODONE-ACETAMINOPHEN 5-325 MG PO TABS: 1.0000 | ORAL_TABLET | Freq: Four times a day (QID) | ORAL | Status: DC | PRN

## 2013-11-01 MED ORDER — HYDROCODONE-ACETAMINOPHEN 5-325 MG PO TABS
1.0000 | ORAL_TABLET | Freq: Once | ORAL | Status: AC
  Administered 2013-11-01: 1 via ORAL
  Filled 2013-11-01: qty 1

## 2013-11-01 MED ORDER — IBUPROFEN 200 MG PO TABS
400.0000 mg | ORAL_TABLET | Freq: Once | ORAL | Status: AC
  Administered 2013-11-01: 400 mg via ORAL
  Filled 2013-11-01: qty 2

## 2013-11-01 MED ORDER — DOCUSATE SODIUM 100 MG PO CAPS: 100.0000 mg | ORAL_CAPSULE | Freq: Two times a day (BID) | ORAL | Status: DC
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
## 2013-11-01 NOTE — ED Notes (Signed)
Bed: WA21 Expected date:  Expected time:  Means of arrival:  Comments: ems- 79yo F, fall, arm splinted

## 2013-12-14 ENCOUNTER — Telehealth: Payer: Self-pay

## 2013-12-14 NOTE — Telephone Encounter (Signed)
PA req was sent by [facility] for premarin. Completed form and faxed.

## 2013-12-18 ENCOUNTER — Other Ambulatory Visit: Payer: Self-pay | Admitting: Emergency Medicine

## 2014-01-25 ENCOUNTER — Other Ambulatory Visit: Payer: Self-pay | Admitting: Physician Assistant

## 2014-01-31 ENCOUNTER — Ambulatory Visit (INDEPENDENT_AMBULATORY_CARE_PROVIDER_SITE_OTHER): Payer: Medicare Other | Admitting: Emergency Medicine

## 2014-01-31 ENCOUNTER — Ambulatory Visit: Payer: Medicare Other

## 2014-01-31 VITALS — BP 126/78 | HR 81 | Temp 98.0°F | Resp 17 | Ht 63.0 in | Wt 132.0 lb

## 2014-01-31 DIAGNOSIS — I10 Essential (primary) hypertension: Secondary | ICD-10-CM

## 2014-01-31 DIAGNOSIS — R7989 Other specified abnormal findings of blood chemistry: Secondary | ICD-10-CM

## 2014-01-31 DIAGNOSIS — R209 Unspecified disturbances of skin sensation: Secondary | ICD-10-CM

## 2014-01-31 DIAGNOSIS — R200 Anesthesia of skin: Secondary | ICD-10-CM

## 2014-01-31 DIAGNOSIS — R946 Abnormal results of thyroid function studies: Secondary | ICD-10-CM

## 2014-01-31 LAB — POCT CBC
GRANULOCYTE PERCENT: 67.8 % (ref 37–80)
HCT, POC: 45.9 % (ref 37.7–47.9)
Hemoglobin: 14.2 g/dL (ref 12.2–16.2)
Lymph, poc: 2.9 (ref 0.6–3.4)
MCH, POC: 32 pg — AB (ref 27–31.2)
MCHC: 30.9 g/dL — AB (ref 31.8–35.4)
MCV: 103.4 fL — AB (ref 80–97)
MID (CBC): 0.9 (ref 0–0.9)
MPV: 10.6 fL (ref 0–99.8)
POC GRANULOCYTE: 7.9 — AB (ref 2–6.9)
POC LYMPH %: 24.7 % (ref 10–50)
POC MID %: 7.5 %M (ref 0–12)
Platelet Count, POC: 343 10*3/uL (ref 142–424)
RBC: 4.44 M/uL (ref 4.04–5.48)
RDW, POC: 16.5 %
WBC: 11.7 10*3/uL — AB (ref 4.6–10.2)
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
## 2014-02-01 LAB — COMPREHENSIVE METABOLIC PANEL
ALT: 15 U/L (ref 0–35)
AST: 25 U/L (ref 0–37)
Albumin: 3.9 g/dL (ref 3.5–5.2)
Alkaline Phosphatase: 75 U/L (ref 39–117)
BUN: 19 mg/dL (ref 6–23)
CO2: 32 meq/L (ref 19–32)
CREATININE: 0.76 mg/dL (ref 0.50–1.10)
Calcium: 10.4 mg/dL (ref 8.4–10.5)
Chloride: 101 mEq/L (ref 96–112)
Glucose, Bld: 106 mg/dL — ABNORMAL HIGH (ref 70–99)
Potassium: 4.3 mEq/L (ref 3.5–5.3)
Sodium: 140 mEq/L (ref 135–145)
Total Bilirubin: 0.6 mg/dL (ref 0.2–1.2)
Total Protein: 7.4 g/dL (ref 6.0–8.3)

## 2014-02-01 LAB — T4, FREE: FREE T4: 0.99 ng/dL (ref 0.80–1.80)

## 2014-02-01 LAB — TSH: TSH: 3.633 u[IU]/mL (ref 0.350–4.500)

## 2014-03-05 ENCOUNTER — Ambulatory Visit (INDEPENDENT_AMBULATORY_CARE_PROVIDER_SITE_OTHER): Payer: Medicare Other | Admitting: Internal Medicine

## 2014-03-05 VITALS — BP 132/62 | HR 66 | Temp 98.4°F | Resp 18 | Wt 131.0 lb

## 2014-03-05 DIAGNOSIS — S1093XA Contusion of unspecified part of neck, initial encounter: Secondary | ICD-10-CM

## 2014-03-05 DIAGNOSIS — S0003XA Contusion of scalp, initial encounter: Secondary | ICD-10-CM

## 2014-03-05 DIAGNOSIS — S0083XA Contusion of other part of head, initial encounter: Secondary | ICD-10-CM

## 2014-03-05 DIAGNOSIS — S00439A Contusion of unspecified ear, initial encounter: Secondary | ICD-10-CM

## 2014-03-05 NOTE — Progress Notes (Addendum)
   Subjective:  This chart was scribed for Shelly Lin, MD, by [name], scribe.   Patient ID: Shelly Morse, female    DOB: 1922/01/31, 80 y.o.   MRN: [id number]  Chief Complaint  Patient presents with  . Fall    hit right side of head and ear    HPI  Shelly Morse is a 80 y.o. female who presents to [facility] complaining of a fall which occurred yesterday afternoon when she slipped on plastic. The pt reports she hit her right temple against a cabinet as she fell, stating she hit her head "solidly."  She reports later that evening she removed the hearing aid from her right ear, the hearing aid was in multiple pieces. She is concerned that pieces of the hearing aid remain in her ear. She denies ear pain.   She is experiencing minimal soreness to her head. She denies LOC or current dizziness. She denies loss of feeling to her feet. She has fallen twice in the past four months. She is following up with a neurologist concerning the hand injured in one of the falls. She states her doctor recommended using support for ambulation, but she declined due to the size of her house.   Fall risk is due to environmental issues at home and these are being addressed  Review of Systems  HENT: Negative for ear pain.   Neurological: Positive for numbness (Hand). Negative for dizziness and syncope.   no headaches. No visual changes.    Objective:   Physical Exam  Nursing note and vitals reviewed. Constitutional: She is oriented to person, place, and time. She appears well-developed and well-nourished. No distress.  HENT:  Head: Normocephalic and atraumatic.  Skin to right ear appears healthy.  There is no foreign body or fragment of hearing aid remaining in the ear canal.  No ecchymoses on the face or scalp.  Normal cephalic.   Eyes: EOM are normal.  Neck: Neck supple.  Cardiovascular: Normal rate.   Pulmonary/Chest: Effort normal. No respiratory distress.  Musculoskeletal: Normal range  of motion.  Neurological: She is alert and oriented to person, place, and time.  Skin: Skin is warm and dry.  Psychiatric: She has a normal mood and affect. Her behavior is normal.    Vitals: BP 132/62  Pulse 66  Temp(Src) 98.4 F (36.9 C) (Oral)  Resp 18  Wt 131 lb (59.421 kg)  SpO2 97%     Assessment & Plan:    Discussed treatment plan with patient, and the patient agreed to the plan.  I have completed the patient encounter in its entirety as documented by the scribe, with editing by me where necessary. Ahnyla Mendel P. Laney Pastor, M.D. Contusion of ear  Reassured

## 2014-03-27 ENCOUNTER — Other Ambulatory Visit: Payer: Self-pay | Admitting: Physician Assistant

## 2014-03-28 ENCOUNTER — Other Ambulatory Visit: Payer: Self-pay | Admitting: Physician Assistant

## 2014-08-19 ENCOUNTER — Emergency Department (HOSPITAL_COMMUNITY): Payer: Medicare Other

## 2014-08-19 ENCOUNTER — Encounter (HOSPITAL_COMMUNITY): Payer: Self-pay | Admitting: Emergency Medicine

## 2014-08-19 ENCOUNTER — Emergency Department (HOSPITAL_COMMUNITY)
Admission: EM | Admit: 2014-08-19 | Discharge: 2014-08-20 | Disposition: A | Discharge status: Home / Self Care | Payer: Medicare Other | Attending: Emergency Medicine | Admitting: Emergency Medicine

## 2014-08-19 DIAGNOSIS — Z90710 Acquired absence of both cervix and uterus: Secondary | ICD-10-CM | POA: Diagnosis not present

## 2014-08-19 DIAGNOSIS — Z87448 Personal history of other diseases of urinary system: Secondary | ICD-10-CM | POA: Insufficient documentation

## 2014-08-19 DIAGNOSIS — Z9889 Other specified postprocedural states: Secondary | ICD-10-CM | POA: Diagnosis not present

## 2014-08-19 DIAGNOSIS — Z79899 Other long term (current) drug therapy: Secondary | ICD-10-CM | POA: Insufficient documentation

## 2014-08-19 DIAGNOSIS — R042 Hemoptysis: Secondary | ICD-10-CM | POA: Diagnosis present

## 2014-08-19 DIAGNOSIS — I10 Essential (primary) hypertension: Secondary | ICD-10-CM | POA: Diagnosis not present

## 2014-08-19 DIAGNOSIS — R51 Headache: Secondary | ICD-10-CM | POA: Insufficient documentation

## 2014-08-19 DIAGNOSIS — Z91040 Latex allergy status: Secondary | ICD-10-CM | POA: Diagnosis not present

## 2014-08-19 DIAGNOSIS — Z8719 Personal history of other diseases of the digestive system: Secondary | ICD-10-CM | POA: Insufficient documentation

## 2014-08-19 DIAGNOSIS — Z8739 Personal history of other diseases of the musculoskeletal system and connective tissue: Secondary | ICD-10-CM | POA: Diagnosis not present

## 2014-08-19 DIAGNOSIS — Z9089 Acquired absence of other organs: Secondary | ICD-10-CM | POA: Diagnosis not present

## 2014-08-19 DIAGNOSIS — J3489 Other specified disorders of nose and nasal sinuses: Secondary | ICD-10-CM | POA: Diagnosis not present

## 2014-08-19 DIAGNOSIS — R05 Cough: Secondary | ICD-10-CM | POA: Diagnosis not present

## 2014-08-19 DIAGNOSIS — R059 Cough, unspecified: Secondary | ICD-10-CM | POA: Insufficient documentation

## 2014-08-19 DIAGNOSIS — R0981 Nasal congestion: Secondary | ICD-10-CM

## 2014-08-19 LAB — I-STAT CHEM 8, ED
BUN: 24 mg/dL — AB (ref 6–23)
CREATININE: 0.9 mg/dL (ref 0.50–1.10)
Calcium, Ion: 1.24 mmol/L (ref 1.13–1.30)
Chloride: 100 mEq/L (ref 96–112)
Glucose, Bld: 105 mg/dL — ABNORMAL HIGH (ref 70–99)
HCT: 45 % (ref 36.0–46.0)
Hemoglobin: 15.3 g/dL — ABNORMAL HIGH (ref 12.0–15.0)
Potassium: 4.1 mEq/L (ref 3.7–5.3)
SODIUM: 141 meq/L (ref 137–147)
TCO2: 29 mmol/L (ref 0–100)

## 2014-08-19 NOTE — ED Notes (Signed)
Pt states she has been having some sinus problems for the past couple weeks and has been coughing up clear phlegm but today she was coughing and leaned forward and coughed up two big blood clots  Pt states this has never happened before  Pt states she is feeling a little light headed and anxious right now

## 2014-08-20 NOTE — Discharge Instructions (Signed)
If you were given medicines take as directed.  If you are on coumadin or contraceptives realize their levels and effectiveness is altered by many different medicines.  If you have any reaction (rash, tongues swelling, other) to the medicines stop taking and see a physician.   Please follow up as directed and return to the ER or see a physician for new or worsening symptoms.  Thank you. Filed Vitals:   08/19/14 2024 08/19/14 2241  BP: 137/54 130/51  Pulse: 76 67  Temp: 98.4 F (36.9 C) 98.4 F (36.9 C)  TempSrc: Oral Oral  Resp: 16 18  SpO2: 98% 97%
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
## 2014-08-27 ENCOUNTER — Encounter: Payer: Self-pay | Admitting: Emergency Medicine

## 2014-08-27 ENCOUNTER — Ambulatory Visit (INDEPENDENT_AMBULATORY_CARE_PROVIDER_SITE_OTHER): Payer: Medicare Other | Admitting: Emergency Medicine

## 2014-08-27 VITALS — BP 110/70 | HR 65 | Temp 98.0°F | Resp 16 | Ht 61.75 in | Wt 133.0 lb

## 2014-08-27 DIAGNOSIS — R042 Hemoptysis: Secondary | ICD-10-CM | POA: Insufficient documentation

## 2014-08-27 NOTE — Progress Notes (Signed)
   Subjective:   Patient ID: Shelly Morse, female    DOB: 05-Nov-1922, 80 y.o.   MRN: [id number]  This chart was scribed for Darlyne Russian, MD by Lowella Petties, Medical Scribe. The patient was seen in room 21. Patient's care was started at 1:08 PM.  HPI HPI Comments: Shelly Morse is a 80 y.o. female who presents to the [facility] for a follow up after an expisode hemoptysis 2 days ago. She reports an associated cough. She was seen in the ED on 08/20/14. She had a CXR which showed emphysema and fibrosis. 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 She reports that other than minor fatigue she generally feels ok. She reports a few stressful events in her life, including her youngest son up for parol after 30 years. She reports that this son is manic depressive, but that he is more steady than her older son and she is excited about him coming home. She reports seeing Dr. Drue Flirt and having a pneumonia shot in the past. She states that she is not interested in the prevnar shot. She reports successfully weaning off hormone therapy. She denies ever smoking, but living with second hand smoke for 55 years.    Review of Systems  Constitutional: Negative for fatigue and unexpected weight change.  Respiratory: Negative for chest tightness and shortness of breath.   Cardiovascular: Negative for chest pain, palpitations and leg swelling.  Gastrointestinal: Negative for abdominal pain and blood in stool.  Neurological: Negative for dizziness, syncope, light-headedness and headaches.   Objective:  Physical Exam  CONSTITUTIONAL: Well developed/well nourished HEAD: Normocephalic/atraumatic EYES: EOMI/PERRL ENMT: Mucous membranes moist NECK: supple no meningeal signs SPINE:entire spine nontender CV: S1/S2 noted, no murmurs/rubs/gallops noted LUNGS: Dry rales in bases bilaterally, no  apparent distress ABDOMEN: soft, nontender, no rebound or guarding GU:no cva tenderness NEURO: Pt is awake/alert, moves all extremitiesx4 EXTREMITIES: pulses normal, full ROM SKIN: warm, color normal PSYCH: no abnormalities of mood noted  Assessment & Plan:   Patient has had no further hemoptysis. She does show changes of fibrosis and emphysema on her chest x-ray. CT scan ordered. I will do it without contrast because of her age I personally performed the services described in this documentation, which was scribed in my presence. The recorded information has been reviewed and is accurate.

## 2014-08-27 NOTE — Progress Notes (Deleted)
   Subjective:    Patient ID: Shelly Morse, female    DOB: [birth year], 80 y.o.   MRN: [id number]  HPI    Review of Systems     Objective:   Physical Exam        Assessment & Plan:

## 2014-09-03 ENCOUNTER — Ambulatory Visit
Admission: RE | Admit: 2014-09-03 | Discharge: 2014-09-03 | Disposition: A | Discharge status: Home / Self Care | Payer: Medicare Other | Source: Ambulatory Visit | Attending: Emergency Medicine | Admitting: Emergency Medicine

## 2014-09-03 DIAGNOSIS — R042 Hemoptysis: Secondary | ICD-10-CM

## 2014-09-15 ENCOUNTER — Ambulatory Visit (INDEPENDENT_AMBULATORY_CARE_PROVIDER_SITE_OTHER): Payer: Medicare Other | Admitting: Emergency Medicine

## 2014-09-15 VITALS — BP 158/65 | HR 60 | Temp 97.5°F | Resp 20 | Ht 62.5 in | Wt 132.2 lb

## 2014-09-15 DIAGNOSIS — R042 Hemoptysis: Secondary | ICD-10-CM
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
## 2014-09-20 ENCOUNTER — Ambulatory Visit (INDEPENDENT_AMBULATORY_CARE_PROVIDER_SITE_OTHER): Payer: Medicare Other | Admitting: Internal Medicine

## 2014-09-20 ENCOUNTER — Other Ambulatory Visit: Payer: Medicare Other

## 2014-09-20 ENCOUNTER — Encounter: Payer: Self-pay | Admitting: Internal Medicine

## 2014-09-20 VITALS — BP 142/60 | HR 73 | Ht 62.0 in | Wt 134.0 lb

## 2014-09-20 DIAGNOSIS — R059 Cough, unspecified: Secondary | ICD-10-CM

## 2014-09-20 DIAGNOSIS — J479 Bronchiectasis, uncomplicated: Secondary | ICD-10-CM

## 2014-09-20 DIAGNOSIS — R7611 Nonspecific reaction to tuberculin skin test without active tuberculosis: Secondary | ICD-10-CM | POA: Insufficient documentation

## 2014-09-20 DIAGNOSIS — Z87898 Personal history of other specified conditions: Secondary | ICD-10-CM | POA: Insufficient documentation

## 2014-09-20 DIAGNOSIS — R05 Cough: Secondary | ICD-10-CM

## 2014-09-20 DIAGNOSIS — Z8709 Personal history of other diseases of the respiratory system: Secondary | ICD-10-CM
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
## 2014-09-20 NOTE — Patient Instructions (Addendum)
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  - this is not contagious - this is currently asymptomatic  And does not warrant treatment  - do not recommend diagnostic investigation due to elevated risk and lack of intervention to treat - it ccan cause chronic cough in the future; if so can discuss treatment which has lot of side effects  - repeat CT chest wo contrast in 6 months  #Coughing up blood sept 2015  -could have been due to acute bronchitis from book dust exposure or the MAI infection above  - glad is resolved  -will monitor clinically  - any recurrence call our office [phone]; might consider short trial antibiotics  #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

## 2014-09-23 LAB — QUANTIFERON TB GOLD ASSAY (BLOOD)
Interferon Gamma Release Assay: NEGATIVE
Mitogen value: >10 IU/mL
QUANTIFERON NIL VALUE: 0.05 [IU]/mL
Quantiferon Tb Ag Minus Nil Value: 0.02 IU/mL
TB Ag value: 0.07 IU/mL

## 2014-10-01 NOTE — Progress Notes (Signed)
Quick Note:  Called and spoke to pt. Informed pt of results per MR. Pt verbalized understanding and denied any further questions or concerns at this time. ______ 

## 2014-10-31 LAB — AFB CULTURE WITH SMEAR (NOT AT ARMC)
ACID FAST SMEAR: NONE SEEN
ACID FAST SMEAR: NONE SEEN
ACID FAST SMEAR: NONE SEEN

## 2014-11-28 ENCOUNTER — Other Ambulatory Visit: Payer: Self-pay | Admitting: Physician Assistant

## 2014-12-01 ENCOUNTER — Other Ambulatory Visit: Payer: Self-pay | Admitting: Physician Assistant

## 2014-12-02 ENCOUNTER — Telehealth: Payer: Self-pay

## 2014-12-02 DIAGNOSIS — I10 Essential (primary) hypertension: Secondary | ICD-10-CM

## 2014-12-02 MED ORDER — LOSARTAN POTASSIUM 50 MG PO TABS: 50.0000 mg | ORAL_TABLET | Freq: Every day | ORAL | Status: DC

## 2014-12-02 NOTE — Telephone Encounter (Signed)
Pharm had not sent request to our office, they sent it to Ryan's current practice bc he was the last to RF it here bf he left. Advised pt of this and RFd losartan until pt's appt. Pt stated she will ask Dr Everlene Farrier for 90 day RFs when he Rxs it again at her OV.

## 2014-12-02 NOTE — Telephone Encounter (Signed)
Pt states [facility] contacted [place] to refill her bp meds and was denied,she has an appt for he annual on jan 12 and needs her meds til then   Best phone [place]

## 2014-12-05 ENCOUNTER — Telehealth: Payer: Self-pay | Admitting: Family Medicine

## 2014-12-05 NOTE — Telephone Encounter (Signed)
Patient declined  

## 2015-01-07 ENCOUNTER — Ambulatory Visit (INDEPENDENT_AMBULATORY_CARE_PROVIDER_SITE_OTHER): Payer: 59 | Admitting: Emergency Medicine

## 2015-01-07 ENCOUNTER — Encounter: Payer: Self-pay | Admitting: Emergency Medicine

## 2015-01-07 VITALS — BP 162/60 | HR 61 | Temp 97.8°F | Resp 16 | Ht 63.0 in | Wt 132.0 lb

## 2015-01-07 DIAGNOSIS — E559 Vitamin D deficiency, unspecified: Secondary | ICD-10-CM

## 2015-01-07 DIAGNOSIS — Z130 Encounter for screening for diseases of the blood and blood-forming organs and certain disorders involving the immune mechanism: Secondary | ICD-10-CM

## 2015-01-07 DIAGNOSIS — Z136 Encounter for screening for cardiovascular disorders: Secondary | ICD-10-CM

## 2015-01-07 DIAGNOSIS — Z0000 Encounter for general adult medical examination without abnormal findings: Secondary | ICD-10-CM

## 2015-01-07 DIAGNOSIS — R7989 Other specified abnormal findings of blood chemistry: Secondary | ICD-10-CM

## 2015-01-07 DIAGNOSIS — Z1329 Encounter for screening for other suspected endocrine disorder: Secondary | ICD-10-CM

## 2015-01-07 DIAGNOSIS — Z1321 Encounter for screening for nutritional disorder: Secondary | ICD-10-CM

## 2015-01-07 DIAGNOSIS — Z131 Encounter for screening for diabetes mellitus: Secondary | ICD-10-CM

## 2015-01-07 DIAGNOSIS — M659 Synovitis and tenosynovitis, unspecified: Secondary | ICD-10-CM

## 2015-01-07 DIAGNOSIS — I10 Essential (primary) hypertension: Secondary | ICD-10-CM

## 2015-01-07 DIAGNOSIS — M6588 Other synovitis and tenosynovitis, other site: Secondary | ICD-10-CM

## 2015-01-07 DIAGNOSIS — Z13228 Encounter for screening for other metabolic disorders: Secondary | ICD-10-CM

## 2015-01-07 LAB — POCT URINALYSIS DIPSTICK
Bilirubin, UA: NEGATIVE
Blood, UA: NEGATIVE
Glucose, UA: NEGATIVE
KETONES UA: NEGATIVE
Nitrite, UA: NEGATIVE
PROTEIN UA: NEGATIVE
SPEC GRAV UA: 1.01
Urobilinogen, UA: 0.2
pH, UA: 5.5

## 2015-01-07 LAB — POCT UA - MICROSCOPIC ONLY
CASTS, UR, LPF, POC: NEGATIVE
CRYSTALS, UR, HPF, POC: NEGATIVE
MUCUS UA: NEGATIVE
Yeast, UA: NEGATIVE

## 2015-01-07 LAB — POCT CBC
Granulocyte percent: 73.9 %G (ref 37–80)
HCT, POC: 46.5 % (ref 37.7–47.9)
Hemoglobin: 14.9 g/dL (ref 12.2–16.2)
Lymph, poc: 2.8 (ref 0.6–3.4)
MCH: 32.1 pg — AB (ref 27–31.2)
MCHC: 32 g/dL (ref 31.8–35.4)
MCV: 100.3 fL — AB (ref 80–97)
MID (CBC): 0.2 (ref 0–0.9)
MPV: 8.9 fL (ref 0–99.8)
POC Granulocyte: 8.4 — AB (ref 2–6.9)
POC LYMPH %: 24.6 % (ref 10–50)
POC MID %: 1.5 %M (ref 0–12)
Platelet Count, POC: 400 10*3/uL (ref 142–424)
RBC: 4.64 M/uL (ref 4.04–5.48)
RDW, POC: 15.7 %
WBC: 11.3 10*3/uL — AB (ref 4.6–10.2)

## 2015-01-07 LAB — T4, FREE: FREE T4: 0.85 ng/dL (ref 0.80–1.80)

## 2015-01-07 LAB — TSH: TSH: 5.471 u[IU]/mL — ABNORMAL HIGH (ref 0.350–4.500)

## 2015-01-07 NOTE — Patient Instructions (Signed)
We are so glad you are doing fine.   We will see you in 6 months for your next appointment. Your appointment with pulmonology is 03/21/2015, at 2:00pm.  Keeping You Healthy  Get These Tests  Blood Pressure- Have your blood pressure checked by your healthcare provider at least once a year.  Normal blood pressure is 120/80.  Weight- Have your body mass index (BMI) calculated to screen for obesity.  BMI is a measure of body fat based on height and weight.  You can calculate your own BMI at [employer]  Cholesterol- Have your cholesterol checked every year.  Diabetes- Have your blood sugar checked every year if you have high blood pressure, high cholesterol, a family history of diabetes or if you are overweight.  Pap Smear- Have a pap smear every 1 to 3 years if you have been sexually active.  If you are older than 65 and recent pap smears have been normal you may not need additional pap smears.  In addition, if you have had a hysterectomy  For benign disease additional pap smears are not necessary.  Mammogram-Yearly mammograms are essential for early detection of breast cancer  Screening for Colon Cancer- Colonoscopy starting at age 75. Screening may begin sooner depending on your family history and other health conditions.  Follow up colonoscopy as directed by your Gastroenterologist.  Screening for Osteoporosis- Screening begins at age 30 with bone density scanning, sooner if you are at higher risk for developing Osteoporosis.  Get these medicines  Calcium with Vitamin D- Your body requires 1200-1500 mg of Calcium a day and [phone] IU of Vitamin D a day.  You can only absorb 500 mg of Calcium at a time therefore Calcium must be taken in 2 or 3 separate doses throughout the day.  Hormones- Hormone therapy has been associated with increased risk for certain cancers and heart disease.  Talk to your healthcare provider about if you need relief from menopausal  symptoms.  Aspirin- Ask your healthcare provider about taking Aspirin to prevent Heart Disease and Stroke.  Get these Immuniztions  Flu shot- Every fall  Pneumonia shot- Once after the age of 64; if you are younger ask your healthcare provider if you need a pneumonia shot.  Tetanus- Every ten years.  Zostavax- Once after the age of 70 to prevent shingles.  Take these steps  Don't smoke- Your healthcare provider can help you quit. For tips on how to quit, ask your healthcare provider or go to www.smokefree.gov or call 1-800 QUIT-NOW.  Be physically active- Exercise 5 days a week for a minimum of 30 minutes.  If you are not already physically active, start slow and gradually work up to 30 minutes of moderate physical activity.  Try walking, dancing, bike riding, swimming, etc.  Eat a healthy diet- Eat a variety of healthy foods such as fruits, vegetables, whole grains, low fat milk, low fat cheeses, yogurt, lean meats, chicken, fish, eggs, dried beans, tofu, etc.  For more information go to www.thenutritionsource.org  Dental visit- Brush and floss teeth twice daily; visit your dentist twice a year.  Eye exam- Visit your Optometrist or Ophthalmologist yearly.  Drink alcohol in moderation- Limit alcohol intake to one drink or less a day.  Never drink and drive.  Depression- Your emotional health is as important as your physical health.  If you're feeling down or losing interest in things you normally enjoy, please talk to your healthcare provider.  Seat Belts- can save your life; always wear  one  Smoke/Carbon Building services engineer- These detectors need to be installed on the appropriate level of your home.  Replace batteries at least once a year.  Violence- If anyone is threatening or hurting you, please tell your healthcare provider.  Living Will/ Health care power of attorney- Discuss with your healthcare provider and family.
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
## 2015-01-08 ENCOUNTER — Ambulatory Visit (INDEPENDENT_AMBULATORY_CARE_PROVIDER_SITE_OTHER): Payer: 59 | Admitting: Emergency Medicine

## 2015-01-08 VITALS — BP 154/70 | HR 58 | Temp 97.3°F | Resp 16

## 2015-01-08 DIAGNOSIS — T888XXS Other specified complications of surgical and medical care, not elsewhere classified, sequela: Secondary | ICD-10-CM

## 2015-01-08 LAB — COMPLETE METABOLIC PANEL WITH GFR
ALBUMIN: 3.9 g/dL (ref 3.5–5.2)
ALT: 17 U/L (ref 0–35)
AST: 28 U/L (ref 0–37)
Alkaline Phosphatase: 76 U/L (ref 39–117)
BILIRUBIN TOTAL: 1 mg/dL (ref 0.2–1.2)
BUN: 26 mg/dL — ABNORMAL HIGH (ref 6–23)
CALCIUM: 10.1 mg/dL (ref 8.4–10.5)
CHLORIDE: 102 meq/L (ref 96–112)
CO2: 31 meq/L (ref 19–32)
CREATININE: 0.8 mg/dL (ref 0.50–1.10)
GFR, EST AFRICAN AMERICAN: 75 mL/min
GFR, Est Non African American: 65 mL/min
GLUCOSE: 83 mg/dL (ref 70–99)
Potassium: 4.9 mEq/L (ref 3.5–5.3)
Sodium: 139 mEq/L (ref 135–145)
TOTAL PROTEIN: 7.8 g/dL (ref 6.0–8.3)
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
## 2015-01-09 LAB — VITAMIN D 25 HYDROXY (VIT D DEFICIENCY, FRACTURES): VIT D 25 HYDROXY: 49 ng/mL (ref 30–100)

## 2015-01-28 ENCOUNTER — Other Ambulatory Visit: Payer: Self-pay | Admitting: Emergency Medicine

## 2015-02-25 ENCOUNTER — Ambulatory Visit (INDEPENDENT_AMBULATORY_CARE_PROVIDER_SITE_OTHER): Payer: Medicare Other | Admitting: Adult Health

## 2015-02-25 ENCOUNTER — Encounter: Payer: Self-pay | Admitting: Adult Health

## 2015-02-25 DIAGNOSIS — J479 Bronchiectasis, uncomplicated: Secondary | ICD-10-CM

## 2015-02-25 NOTE — Progress Notes (Signed)
Subjective:    Patient ID: Shelly Morse, female    DOB: 11/07/1922, 81 y.o.   MRN: [id number] PCP Jenny Reichmann, MD HPI  IOV 09/20/2014  Chief Complaint  Patient presents with  . Pulmonary Consult    Pt referred by Dr. Everlene Farrier for abnormal CT.    Near 81 year old female very sharp cognitively and kind of frail. Refer to primary care physician for abnormal chest CT in the context of hemoptysis. She is a passive smoker for 55 years. She is currently independent and drives. Her son lives next door. 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 Apparently these books have been dusty and she has been exposed to this dust along with glue  chemicals and possibly mold. Then a few months ago she developed some cough with mucus production and sinus drainage. Done 08/19/2014 chin and episode of hemoptysis and went to the emergency room. Apparently she was not given antibiotics. Chest x-ray showed emphysema and pulmonary fibrosis chronic findings and therefore she was referred back to the primary care physician. She saw Dr. Everlene Farrier on 08/27/2014 and a CT scan of the chest was ordered. CT scan of the chest done 09/03/2014 showed only tree in bud nodularity in the lingula along with some bronchiectasis.   - She is extremely concerned that she has recurrence of mycobacterium tuberculosis. Apparently in the 1960s she was exposed to her uncle with mycobacterium tuberculosis and since then she is being positive PPD. She denies being treated for latent tuberculosis and she is upset that physicians have not offered her this option  - She is also upset that the chest x-ray showed chronic findings and she believes that the health care system has deliberately took another dog about chronic lung disease findings. It is to be noted that on the chest x-ray  shows chronic findings of the CT scan does not show emphysema pulmonary fibrosis  - In any event her cough has settled and almost resolved especially after she stop dusting the books    02/25/2015 6 month follow up : Bronchiectasis w/ tree in bud infiltrates left lung lingula  Pt returns for a 6 month follow up .  See previously for abn CT chest with bronchietasis possible MAI. With cough and hemoptysis  Hemoptysis resoved. Cough improved . Workup showed :  Quantiferon gold test neg  Sputum for AFB neg. .  Since last ov doing well, remains independent . She has had no hemoptyiss . Marland Kitchen Has occasional cough with clear mucus. No abx use since last ov.  No ER or hospital visits.      Review of Systems  Constitutional: Negative for fever and unexpected weight change.  HENT: Positive for postnasal drip. Negative for dental problem, ear pain, nosebleeds, rhinorrhea, sinus pressure, sneezing, sore throat and trouble swallowing.   Eyes: Negative for redness and itching.  Respiratory: Negative for cough, chest tightness, shortness of breath and wheezing.   Cardiovascular: . Negative for palpitations.  Gastrointestinal: Negative for nausea and vomiting.  Genitourinary: Negative for dysuria.  Musculoskeletal: Negative for joint swelling.  Skin: Negative for rash.  Neurological: Negative for headaches.  Hematological: Does not bruise/bleed easily.  Psychiatric/Behavioral: Negative for dysphoric mood. The patient is not nervous/anxious.        Objective:   Physical Exam  GEN: A/Ox3;  pleasant , NAD, thin frail   HEENT:  [place]/AT,  EACs-clear, TMs-wnl, NOSE-clear, THROAT-clear, no lesions, no postnasal drip or exudate noted.   NECK:  Supple w/ fair ROM; no JVD; normal carotid impulses w/o bruits; no thyromegaly or nodules palpated; no lymphadenopathy.  RESP  Clear  P & A; w/o, wheezes/ rales/ or rhonchi.no accessory muscle use, no dullness to percussion,   CARD:  RRR, no m/r/g  , no peripheral  edema, pulses intact, no cyanosis or clubbing.  GI:   Soft & nt; nml bowel sounds; no organomegaly or masses detected.  Musco: Warm bil, no deformities or joint swelling noted. Kyphosis   Neuro: alert, no focal deficits noted.    Skin: Warm, no lesions or rashes    CT chest 09/03/14  -  IMPRESSION:  1. Linear scarring with some nodularity and bronchiectasis primarily  in the lingula and to a lesser degree in the right middle lobe.  Favor prior inflammatory process possibly atypical such as  mycobacterium avium complex. No definite active process.  2. Prominence of the GE junction may indicate a small hiatal hernia.  Correlate clinically.  3. Calcified granuloma in the right lower lobe with calcified  mediastinal and hilar nodes consistent with prior granulomatous  disease.  4. Coronary artery calcifications.  Electronically Signed  By: Ivar Drape M.D.  On: 09/03/2014 17:00         Assessment & Plan:

## 2015-02-25 NOTE — Assessment & Plan Note (Addendum)
Compensated without flare  Workup with neg sputum for AFB and quantiferon gold neg.  She is essentially asymptomatic with minimal cough .  Will set up for CT chest to follow Bronchiectasis and nodularity along lingula .   Plan  We are setting you up for a CT chest to follow your Bronchiectasis .  Mucinex DM Twice daily  As needed  Cough/congestion  Follow up Dr. Chase Caller in 6 months and As needed

## 2015-02-25 NOTE — Patient Instructions (Signed)
We are setting you up for a CT chest to follow your Bronchiectasis .  Mucinex DM Twice daily  As needed  Cough/congestion  Follow up Dr. Chase Caller in 6 months and As needed

## 2015-03-06 ENCOUNTER — Ambulatory Visit (INDEPENDENT_AMBULATORY_CARE_PROVIDER_SITE_OTHER)
Admission: RE | Admit: 2015-03-06 | Discharge: 2015-03-06 | Disposition: A | Discharge status: Home / Self Care | Payer: Medicare Other | Source: Ambulatory Visit | Attending: Adult Health | Admitting: Adult Health

## 2015-03-06 DIAGNOSIS — J479 Bronchiectasis, uncomplicated: Secondary | ICD-10-CM

## 2015-03-21 ENCOUNTER — Ambulatory Visit: Payer: Medicare Other | Admitting: Adult Health

## 2015-04-21 ENCOUNTER — Encounter: Payer: Self-pay | Admitting: *Deleted

## 2015-05-09 ENCOUNTER — Ambulatory Visit (INDEPENDENT_AMBULATORY_CARE_PROVIDER_SITE_OTHER): Payer: Medicare Other | Admitting: Emergency Medicine

## 2015-05-09 ENCOUNTER — Ambulatory Visit (INDEPENDENT_AMBULATORY_CARE_PROVIDER_SITE_OTHER): Payer: Medicare Other

## 2015-05-09 VITALS — BP 110/70 | HR 71 | Temp 98.0°F | Resp 16 | Ht 63.0 in | Wt 135.8 lb

## 2015-05-09 DIAGNOSIS — R9389 Abnormal findings on diagnostic imaging of other specified body structures: Secondary | ICD-10-CM

## 2015-05-09 DIAGNOSIS — R938 Abnormal findings on diagnostic imaging of other specified body structures: Secondary | ICD-10-CM

## 2015-05-09 DIAGNOSIS — R042 Hemoptysis: Secondary | ICD-10-CM

## 2015-05-09 DIAGNOSIS — J470 Bronchiectasis with acute lower respiratory infection: Secondary | ICD-10-CM | POA: Diagnosis not present

## 2015-05-09 LAB — POCT CBC
Granulocyte percent: 68.2 %G (ref 37–80)
HEMATOCRIT: 45.3 % (ref 37.7–47.9)
HEMOGLOBIN: 14.6 g/dL (ref 12.2–16.2)
Lymph, poc: 2.9 (ref 0.6–3.4)
MCH, POC: 31.7 pg — AB (ref 27–31.2)
MCHC: 32.2 g/dL (ref 31.8–35.4)
MCV: 98.5 fL — AB (ref 80–97)
MID (cbc): 0.4 (ref 0–0.9)
MPV: 8.8 fL (ref 0–99.8)
PLATELET COUNT, POC: 398 10*3/uL (ref 142–424)
POC GRANULOCYTE: 7 — AB (ref 2–6.9)
POC LYMPH PERCENT: 27.7 %L (ref 10–50)
POC MID %: 4.1 %M (ref 0–12)
RBC: 4.6 M/uL (ref 4.04–5.48)
RDW, POC: 15.6 %
WBC: 10.3 10*3/uL — AB (ref 4.6–10.2)

## 2015-05-09 MED ORDER — DOXYCYCLINE HYCLATE 100 MG PO TABS: 100.0000 mg | ORAL_TABLET | Freq: Two times a day (BID) | ORAL | Status: DC

## 2015-05-09 NOTE — Progress Notes (Addendum)
   Subjective:    Patient ID: Shelly Morse, female    DOB: September 29, 1922, 81 y.o.   MRN: [id number] This chart was scribed for Arlyss Queen, MD by Marti Sleigh, Medical Scribe. This patient was seen in Room 8 and the patient's care was started at 10:29 AM.  Chief Complaint  Patient presents with  . Advice Only    hemoptysis    HPI HPI Comments: Shelly Morse is a 81 y.o. female who presents to [facility] complaining of bright red hemoptysis this morning. She has a hx of hemoptysis. She has been evaluated by Dr Lynford Citizen pulmonary specialist. It was felt that she had bronchiectasis with possible atypical mycobacteria infection. She had a CT scan done 03/06/15, which showed increased nodularity and opacity in the left upper lobe most likely secondary to an atypical inflammatory process, and evidence of previous granulomatous.  She has had previous workup for tuberculosis and had a negative quantiferon gold.  Review of Systems  Constitutional: Negative for fever and chills.  HENT: Positive for nosebleeds.   Respiratory: Negative for shortness of breath and wheezing.   Cardiovascular: Positive for palpitations. Negative for chest pain.       Objective:   Physical Exam  Constitutional: She is oriented to person, place, and time. She appears well-developed and well-nourished. No distress.  HENT:  Head: Normocephalic and atraumatic.  Eyes: Pupils are equal, round, and reactive to light.  Neck: Neck supple.  Cardiovascular: Normal rate.   Pulmonary/Chest: Effort normal. No respiratory distress.  Musculoskeletal: Normal range of motion.  Neurological: She is alert and oriented to person, place, and time. Coordination normal.  Skin: Skin is warm and dry. She is not diaphoretic.  Psychiatric: She has a normal mood and affect. Her behavior is normal.  Nursing note and vitals reviewed.  [facility] reading (PRIMARY) by  Dr. Everlene Farrier. Minimal infiltrate lingula, previous granulomatous disease. Hx of  bronchiectasis.  Results for orders placed or performed in visit on 05/09/15  POCT CBC  Result Value Ref Range   WBC 10.3 (A) 4.6 - 10.2 K/uL   Lymph, poc 2.9 0.6 - 3.4   POC LYMPH PERCENT 27.7 10 - 50 %L   MID (cbc) 0.4 0 - 0.9   POC MID % 4.1 0 - 12 %M   POC Granulocyte 7.0 (A) 2 - 6.9   Granulocyte percent 68.2 37 - 80 %G   RBC 4.60 4.04 - 5.48 M/uL   Hemoglobin 14.6 12.2 - 16.2 g/dL   HCT, POC 45.3 37.7 - 47.9 %   MCV 98.5 (A) 80 - 97 fL   MCH, POC 31.7 (A) 27 - 31.2 pg   MCHC 32.2 31.8 - 35.4 g/dL   RDW, POC 15.6 %   Platelet Count, POC 398 142 - 424 K/uL   MPV 8.8 0 - 99.8 fL       Assessment & Plan:  We'll treat with doxycycline. Referral made back to pulmonary. We'll see if one of the other providers there can see the patient.I personally performed the services described in this documentation, which was scribed in my presence. The recorded information has been reviewed and is accurate. The hemostasis most likely is coming from an area of bronchiectasis. Previous Quantiferrin gold study was normal  Nena Jordan, MD

## 2015-05-09 NOTE — Patient Instructions (Signed)
Hemoptysis  Hemoptysis, which means coughing up blood, can be a sign of a minor problem or a serious medical condition. The blood that is coughed up may come from the lungs and airways. Coughed-up blood can also come from bleeding that occurs outside the lungs and airways. Blood can drain into the windpipe during a severe nosebleed or when blood is vomited from the stomach. Because hemoptysis can be a sign of something serious, a medical evaluation is required. For some people with hemoptysis, no definite cause is ever identified.  CAUSES   The most common cause of hemoptysis is bronchitis. Some other common causes include:    A ruptured blood vessel caused by coughing or an infection.    A medical condition that causes damage to the large air passageways (bronchiectasis).    A blood clot in the lungs (pulmonary embolism).    Pneumonia.    Tuberculosis.    Breathing in a small foreign object.    Cancer.  For some people with hemoptysis, no definite cause is ever identified.   HOME CARE INSTRUCTIONS   Only take over-the-counter or prescription medicines as directed by your caregiver. Do not use cough suppressants unless your caregiver approves.   If your caregiver prescribes antibiotic medicines, take them as directed. Finish them even if you start to feel better.   Do not smoke. Also avoid secondhand smoke.   Follow up with your caregiver as directed.  SEEK IMMEDIATE MEDICAL CARE IF:    You cough up bloody mucus for longer than a week.   You have a blood-producing cough that is severe or getting worse.   You have a blood-producing cough thatcomes and goes over time.   You develop problems with your breathing.    You vomit blood.   You develop bloody or black-colored stools.   You have chest pain.    You develop night sweats.   You feel faint or pass out.    You have a fever or persistent symptoms for more than 2-3 days.   You have a fever and your symptoms suddenly get worse.  MAKE  SURE YOU:   Understand these instructions.   Will watch your condition.   Will get help right away if you are not doing well or get worse.  Document Released: 02/21/2002 Document Revised: 11/29/2012 Document Reviewed: 09/29/2012  ExitCare Patient Information 2015 [employer]. This information is not intended to replace advice given to you by your health care provider. Make sure you discuss any questions you have with your health care provider.

## 2015-05-14 ENCOUNTER — Telehealth: Payer: Self-pay | Admitting: Internal Medicine

## 2015-05-14 NOTE — Telephone Encounter (Signed)
i think this is a triage message

## 2015-05-14 NOTE — Telephone Encounter (Signed)
Tried to speak to this patient about getting her possible scheduled with another MD. She was very rude on the phone. I was trying to help her and I kept being interrupted and talked over. States that she is still getting up blood clots. Advised her that if she was still coughing up blood clots she needs to let us know. She then proceeded to yell at, " I'm not coughing what part of that don't you understand." I offered to let her speak to another nurse and she stated, " No thank you" and hung up on me.

## 2015-05-14 NOTE — Telephone Encounter (Signed)
Per pt she wants [place] to call dr Everlene Farrier

## 2015-05-14 NOTE — Telephone Encounter (Signed)
Per Dr. Arlyss Queen "See if we can get patient in to see Dr. Joya Gaskins or Dr. Gwenette Greet. Patient did not do well with her appointment with Dr. Chase Caller. It would be better if one of the other pulmonary specialist could see her." This is in our referral que

## 2015-07-30 ENCOUNTER — Other Ambulatory Visit: Payer: Self-pay | Admitting: Emergency Medicine

## 2015-08-29 ENCOUNTER — Other Ambulatory Visit: Payer: Self-pay | Admitting: Emergency Medicine

## 2015-09-21 ENCOUNTER — Ambulatory Visit (INDEPENDENT_AMBULATORY_CARE_PROVIDER_SITE_OTHER): Payer: Medicare Other | Admitting: Emergency Medicine

## 2015-09-21 VITALS — BP 150/70 | HR 76 | Temp 98.2°F | Resp 18 | Ht 63.0 in | Wt 137.0 lb

## 2015-09-21 DIAGNOSIS — I10 Essential (primary) hypertension: Secondary | ICD-10-CM

## 2015-09-21 DIAGNOSIS — Z23 Encounter for immunization: Secondary | ICD-10-CM | POA: Diagnosis not present

## 2015-09-21 MED ORDER — LOSARTAN POTASSIUM 50 MG PO TABS: ORAL_TABLET | ORAL | Status: DC
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
## 2015-09-22 LAB — BASIC METABOLIC PANEL WITH GFR
BUN: 25 mg/dL (ref 7–25)
CHLORIDE: 100 mmol/L (ref 98–110)
CO2: 33 mmol/L — AB (ref 20–31)
CREATININE: 0.87 mg/dL (ref 0.60–0.88)
Calcium: 9.9 mg/dL (ref 8.6–10.4)
GFR, Est African American: 68 mL/min (ref >=60)
GFR, Est Non African American: 59 mL/min — ABNORMAL LOW (ref >=60)
Glucose, Bld: 96 mg/dL (ref 65–99)
Potassium: 4.3 mmol/L (ref 3.5–5.3)
Sodium: 140 mmol/L (ref 135–146)

## 2015-09-22 LAB — CBC WITH DIFFERENTIAL/PLATELET
Basophils Absolute: 0 10*3/uL (ref 0.0–0.1)
Basophils Relative: 0 % (ref 0–1)
EOS ABS: 0 10*3/uL (ref 0.0–0.7)
Eosinophils Relative: 0 % (ref 0–5)
HCT: 43.2 % (ref 36.0–46.0)
HEMOGLOBIN: 14.4 g/dL (ref 12.0–15.0)
Lymphocytes Relative: 31 % (ref 12–46)
Lymphs Abs: 2.9 10*3/uL (ref 0.7–4.0)
MCH: 32 pg (ref 26.0–34.0)
MCHC: 33.3 g/dL (ref 30.0–36.0)
MCV: 96 fL (ref 78.0–100.0)
MPV: 11.9 fL (ref 8.6–12.4)
Monocytes Absolute: 0.7 10*3/uL (ref 0.1–1.0)
Monocytes Relative: 7 % (ref 3–12)
NEUTROS PCT: 62 % (ref 43–77)
Neutro Abs: 5.8 10*3/uL (ref 1.7–7.7)
PLATELETS: 434 10*3/uL — AB (ref 150–400)
RBC: 4.5 MIL/uL (ref 3.87–5.11)
RDW: 15.5 % (ref 11.5–15.5)
WBC: 9.3 10*3/uL (ref 4.0–10.5)

## 2015-09-30 ENCOUNTER — Encounter: Payer: Self-pay | Admitting: Emergency Medicine

## 2015-11-07 ENCOUNTER — Encounter: Payer: Self-pay | Admitting: Internal Medicine

## 2015-11-07 ENCOUNTER — Ambulatory Visit (INDEPENDENT_AMBULATORY_CARE_PROVIDER_SITE_OTHER): Payer: Medicare Other | Admitting: Internal Medicine

## 2015-11-07 VITALS — BP 148/80 | HR 74 | Ht 63.0 in | Wt 135.0 lb

## 2015-11-07 DIAGNOSIS — J479 Bronchiectasis, uncomplicated: Secondary | ICD-10-CM | POA: Diagnosis not present

## 2015-11-07 NOTE — Patient Instructions (Signed)
ICD-9-CM ICD-10-CM   1. Bronchiectasis without complication ([place]) A999333 J47.9     Glad you are doing well without much cough  Plan CXR 2 view in 7-8 months Followup after cxr in 7-8 months
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
## 2015-11-12 ENCOUNTER — Ambulatory Visit (INDEPENDENT_AMBULATORY_CARE_PROVIDER_SITE_OTHER): Payer: Medicare Other | Admitting: Emergency Medicine

## 2015-11-12 ENCOUNTER — Encounter: Payer: Self-pay | Admitting: Emergency Medicine

## 2015-11-12 VITALS — BP 130/70 | HR 68 | Temp 98.1°F | Resp 12 | Ht 63.0 in | Wt 139.6 lb

## 2015-11-12 DIAGNOSIS — J470 Bronchiectasis with acute lower respiratory infection: Secondary | ICD-10-CM
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
## 2015-11-18 ENCOUNTER — Other Ambulatory Visit: Payer: Self-pay | Admitting: Emergency Medicine

## 2015-11-21 ENCOUNTER — Other Ambulatory Visit: Payer: Self-pay | Admitting: Emergency Medicine

## 2015-11-27 ENCOUNTER — Telehealth: Payer: Self-pay

## 2015-11-27 MED ORDER — LOSARTAN POTASSIUM 50 MG PO TABS: ORAL_TABLET | ORAL | Status: DC

## 2015-11-27 NOTE — Telephone Encounter (Signed)
Pt recently put in a med refill request and was told that she needed a visit before it could be filled.  She states that she just saw dr Shelly Morse on 11/12/15 does she still need to come in before she can get her meds  Please call [phone]

## 2015-11-27 NOTE — Telephone Encounter (Signed)
Rx sent in

## 2015-12-03 ENCOUNTER — Other Ambulatory Visit: Payer: Self-pay | Admitting: Emergency Medicine

## 2015-12-17 IMAGING — CT CT CHEST W/O CM
3 of 4 series · 17 of 30 positions shown, 19 images · non-contrast
Comparison: Chest x-ray of 08/19/2014

CLINICAL DATA: Hemoptysis, cough

EXAM:
CT CHEST WITHOUT CONTRAST
TECHNIQUE: Multidetector CT imaging of the chest was performed following the
standard protocol without IV contrast..

[Series 3: chest w/o · axial · non-contrast · 0.58mm/px · z∈[-226,-26]mm · 5 of 60 slices shown, 7 images]
[im 10/60  mediastinal]
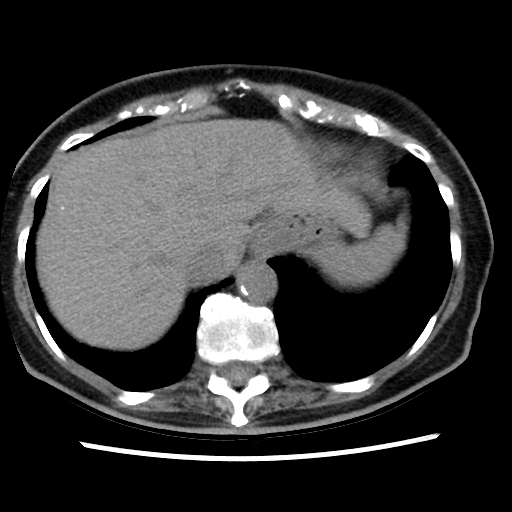
[im 10/60  lung]
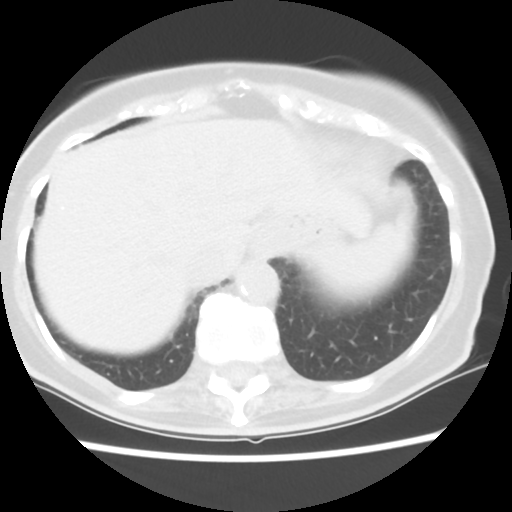
[im 20/60  lung]
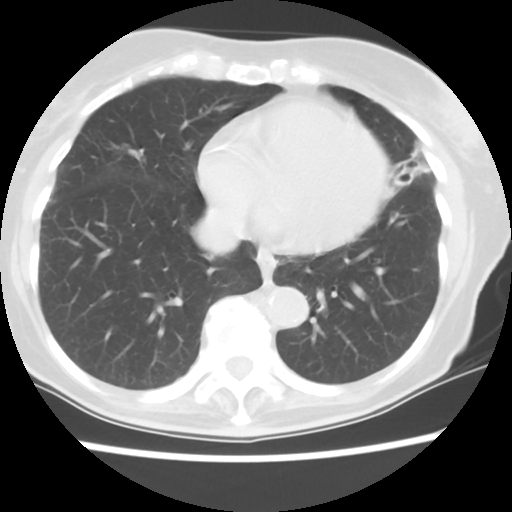
[im 30/60  lung]
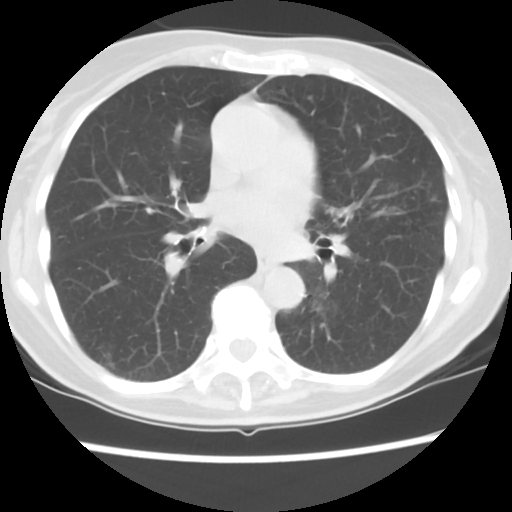
[im 40/60  lung]
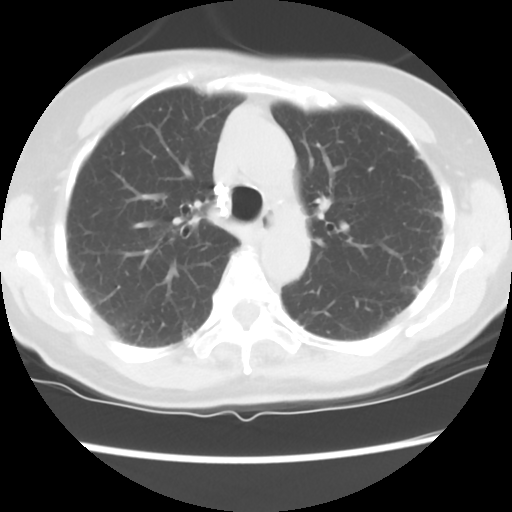
[im 50/60  mediastinal]
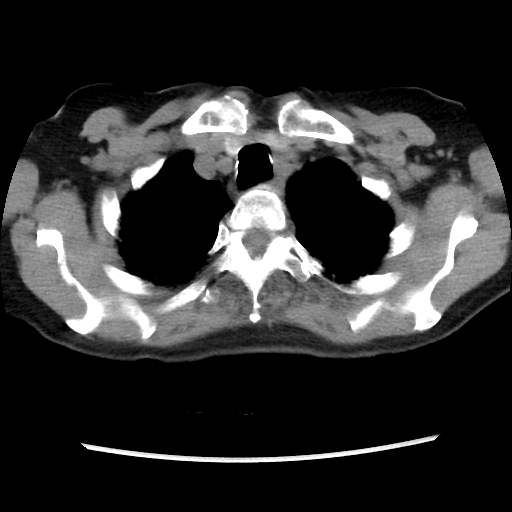
[im 50/60  lung]
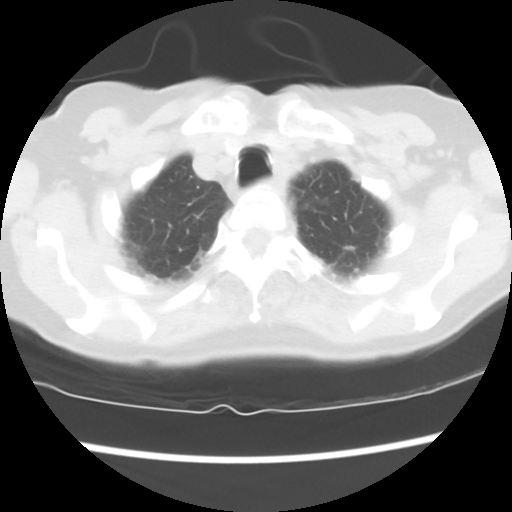

[Series 4: lung windows · axial · 0.58mm/px · z∈[-226,-26]mm · 5 of 60 slices shown]
[im 10/60  lung]
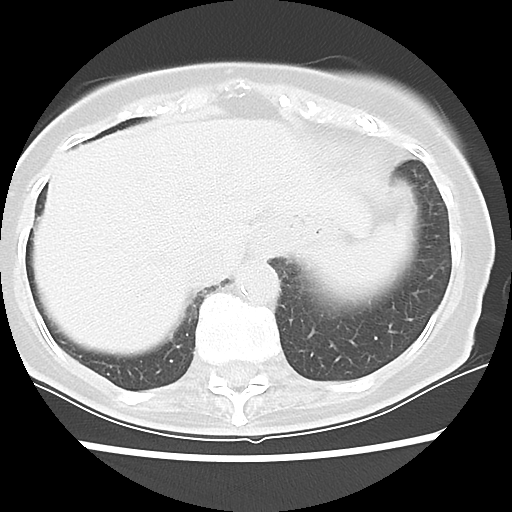
[im 20/60  lung]
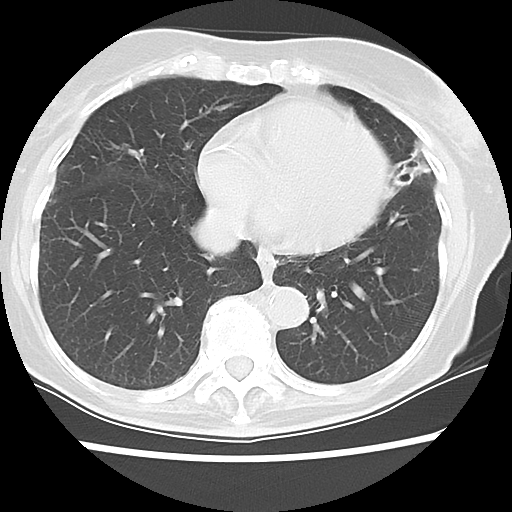
[im 30/60  lung]
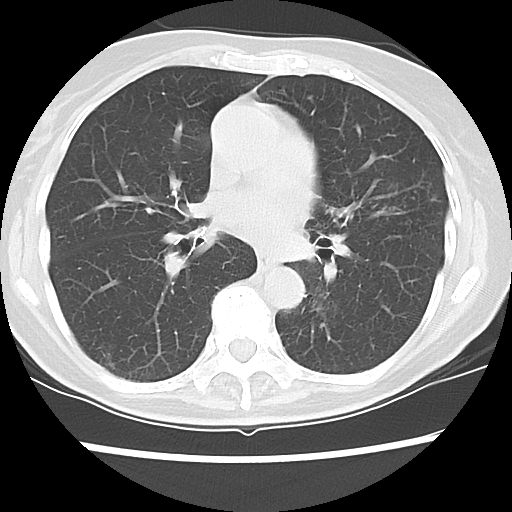
[im 40/60  lung]
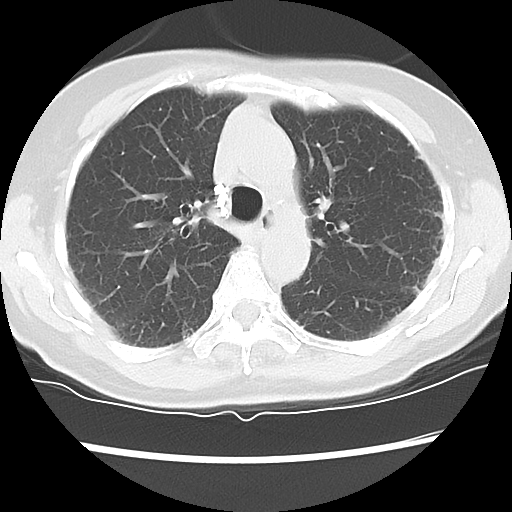
[im 50/60  lung]
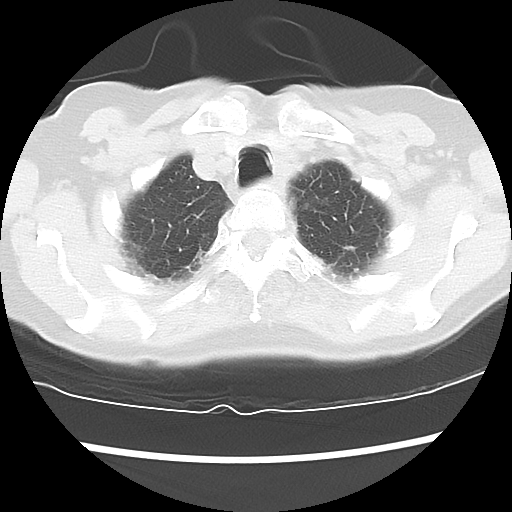

[Series 602: sagittal body · sagittal · 0.58mm/px · 7 of 120 slices shown]
[im 10/120  mediastinal]
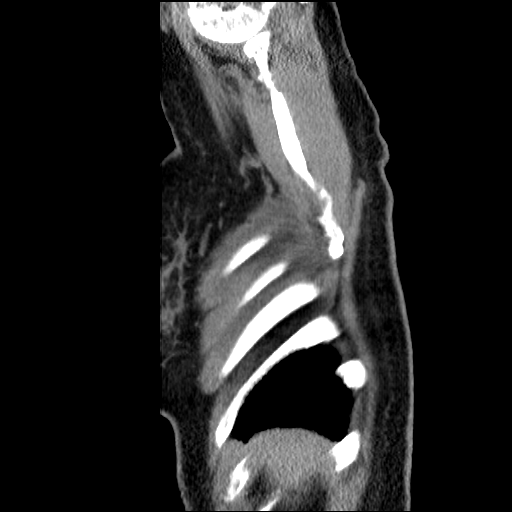
[im 28/120  mediastinal]
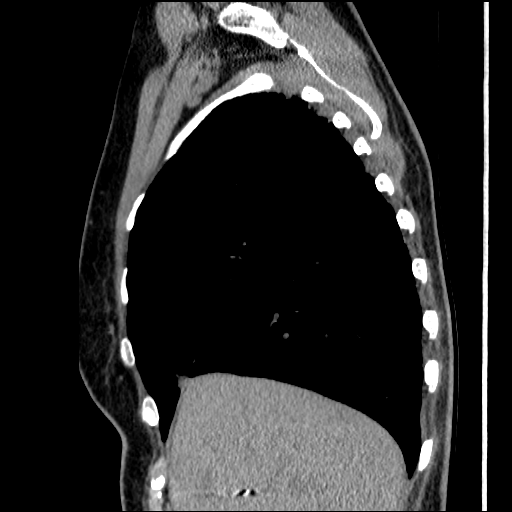
[im 37/120  mediastinal]
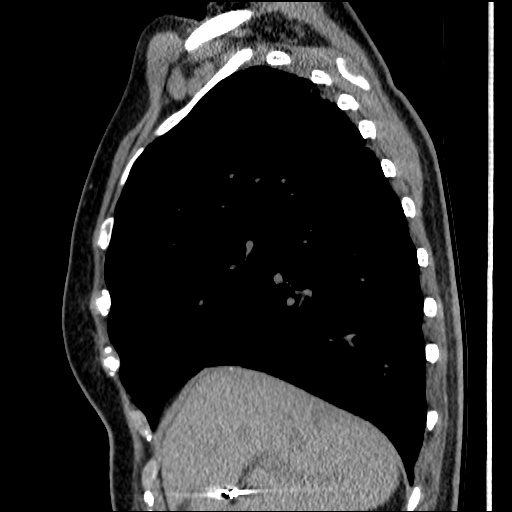
[im 55/120  mediastinal]
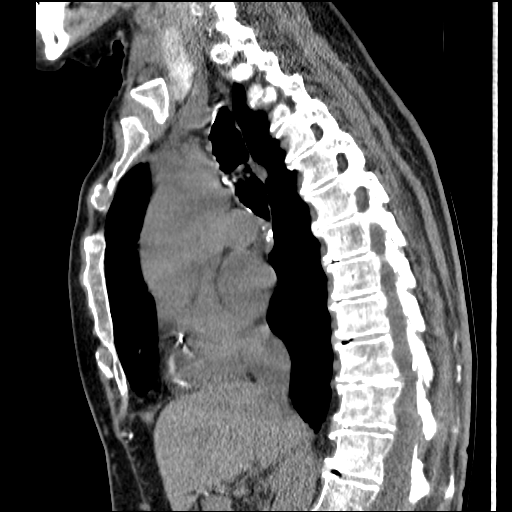
[im 65/120  mediastinal]
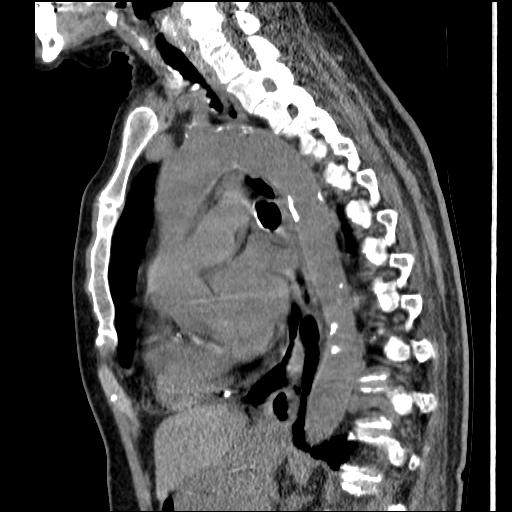
[im 83/120  mediastinal]
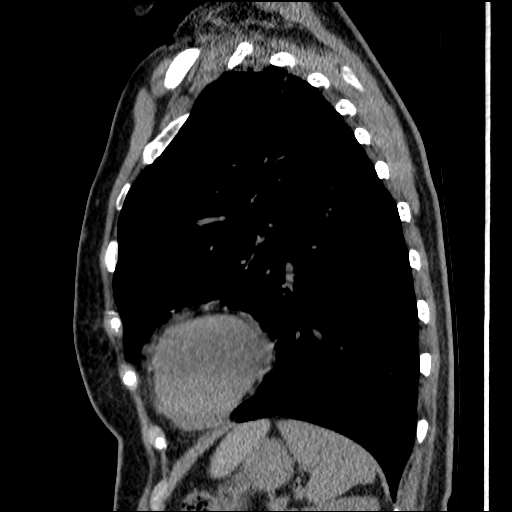
[im 92/120  mediastinal]
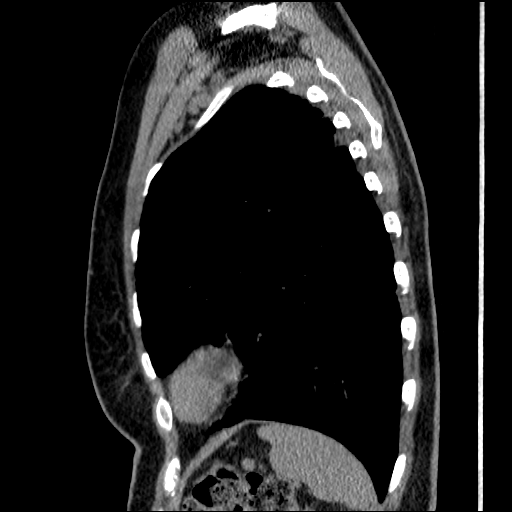

[17 of 30 positions shown; findings below may reference images not displayed]

FINDINGS: Biapical pleural parenchymal scarring is present. Prominent
interlobular septae are noted peripherally. Within the lingula there
are small nodules present with scarring and mild bronchiectatic
change. This finding may indicate prior inflammatory process,
possibly atypical, such as mycobacterium avium complex. Scarring is
noted in the right middle lobe as well. A calcified granuloma is
present within the posterior medial right lower lobe. No active
infiltrate or effusion is seen. Calcified right paratracheal and
hilar nodes as well as mediastinal nodes are noted from prior
granulomatous disease.

On soft tissue window images, the thyroid gland is unremarkable. On
this unenhanced study, there are a few small mediastinal and hilar
nodes some which are calcified. No adenopathy is seen. Coronary
artery calcifications are present primarily in the distribution of
the left anterior descending and circumflex arteries. A small hiatal
hernia is present. Surgical clips are noted from prior
cholecystectomy. There are degenerative changes diffusely throughout
the thoracic spine.
IMPRESSION: 1. Linear scarring with some nodularity and bronchiectasis primarily
in the lingula and to a lesser degree in the right middle lobe.
Favor prior inflammatory process possibly atypical such as
mycobacterium avium complex. No definite active process.
2. Prominence of the GE junction may indicate a small hiatal hernia.
Correlate clinically.
3. Calcified granuloma in the right lower lobe with calcified
mediastinal and hilar nodes consistent with prior granulomatous
disease.
4. Coronary artery calcifications.

## 2016-02-10 ENCOUNTER — Other Ambulatory Visit: Payer: Self-pay

## 2016-02-10 MED ORDER — LOSARTAN POTASSIUM 50 MG PO TABS: ORAL_TABLET | ORAL | Status: DC

## 2016-05-05 ENCOUNTER — Ambulatory Visit (INDEPENDENT_AMBULATORY_CARE_PROVIDER_SITE_OTHER): Payer: Medicare Other | Admitting: Internal Medicine

## 2016-05-05 ENCOUNTER — Ambulatory Visit (INDEPENDENT_AMBULATORY_CARE_PROVIDER_SITE_OTHER)
Admission: RE | Admit: 2016-05-05 | Discharge: 2016-05-05 | Disposition: A | Discharge status: Home / Self Care | Payer: Medicare Other | Source: Ambulatory Visit | Attending: Internal Medicine | Admitting: Internal Medicine

## 2016-05-05 ENCOUNTER — Encounter: Payer: Self-pay | Admitting: Internal Medicine

## 2016-05-05 VITALS — BP 132/70 | HR 81 | Ht 63.0 in | Wt 138.0 lb

## 2016-05-05 DIAGNOSIS — J479 Bronchiectasis, uncomplicated: Secondary | ICD-10-CM

## 2016-05-05 DIAGNOSIS — J387 Other diseases of larynx: Secondary | ICD-10-CM

## 2016-05-05 DIAGNOSIS — R0982 Postnasal drip: Secondary | ICD-10-CM | POA: Diagnosis not present

## 2016-05-05 MED ORDER — FLUTICASONE PROPIONATE 50 MCG/ACT NA SUSP: 2.0000 | Freq: Every day | NASAL | Status: DC

## 2016-05-05 NOTE — Addendum Note (Signed)
Addended by: Len Blalock on: 05/05/2016 03:59 PM   Modules accepted: Orders
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
## 2016-05-05 NOTE — Patient Instructions (Signed)
ICD-9-CM ICD-10-CM   1. Bronchiectasis without complication (HCC) A999333 J47.9   2. Post-nasal drip 784.91 R09.82   3. Irritable larynx 478.79 J38.7     For post nasal drip:   -START/ take generic fluticasone inhaler 2 squirts each nostril daily  For bronchiectasis: no active treatment  followup  - as needed

## 2016-05-06 ENCOUNTER — Telehealth: Payer: Self-pay | Admitting: Internal Medicine

## 2016-05-06 NOTE — Telephone Encounter (Signed)
lmtcb

## 2016-05-07 NOTE — Telephone Encounter (Signed)
ATC x 2, NA wcb

## 2016-05-12 NOTE — Telephone Encounter (Signed)
Spoke with pt, states she has concerns taking flonase d/t side effects listed on bottle-pt is concerned about risks for glaucoma.   Pt states she is currently using 1 puff each nare qd and is working well, wants to know if she can take this in this way to help reduce risk of side effects.   Pt also requesting cxr results from last week.    MR please advise.  Thanks!

## 2016-05-13 ENCOUNTER — Telehealth: Payer: Self-pay | Admitting: Internal Medicine

## 2016-05-13 NOTE — Telephone Encounter (Signed)
LMTCB

## 2016-05-13 NOTE — Telephone Encounter (Signed)
See previous phone note.   Pt is aware of CXR report:  REg CXR - baseline - no change. No new process.  Nothing more needed at this time.

## 2016-05-13 NOTE — Telephone Encounter (Signed)
Spoke with patient-she was made aware of CXR report. Pt states she had cataract NOT glaucoma. She would still like to have information about the safety of using Flonase due to the noted issues it can cause such as cataract and glaucoma.    MR Please advise. Thanks
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
## 2016-05-17 NOTE — Telephone Encounter (Signed)
MR please advise. Thanks! 

## 2016-05-20 NOTE — Telephone Encounter (Signed)
ATC pt x 3 - line busy each time.  WCB

## 2016-05-20 NOTE — Telephone Encounter (Signed)
Here is a full list - you can print it for her and she can pick it up. IT pertains to flonase. You can tell her in my personal experience is well tolerated and if any problems it goes away when we stop it  Adverse Reactions >10%: Central nervous system: Headache (7% to 16%)  1% to 10%:  Central nervous system: Dizziness (1% to 3%), generalized ache (1% to 3%)  Gastrointestinal: Nausea and vomiting (3% to 5%), abdominal pain (1% to 3%), diarrhea (1% to 3%)  Neuromuscular & skeletal: Back pain (1%)  Respiratory: Pharyngitis (6% to 8%), epistaxis (4% to 7%), acute asthma (3% to 7%), cough (3% to 4%), pharyngolaryngeal pain (2% to 4%), blood in nasal mucous (1% to 3%), bronchitis (1% to 3%), flu-like symptoms (1% to 3%), rhinorrhea (1% to 3%), nasal mucosa ulcer (1%)  Miscellaneous: Fever (1% to 5%)  <1%, postmarketing, and/or case reports: Altered sense of smell, anaphylactoid reaction, anaphylaxis, angioedema, blurred vision, bronchospasm, burning sensation of the nose, cataract, conjunctivitis, dry eye syndrome, dry nose, dry throat, dysgeusia, dyspnea, eye irritation, facial edema, glaucoma, hoarseness, hypersensitivity reaction, increased intraocular pressure, increased serum AST, local irritation (nose), nasal candidiasis, nasal septum perforation (rare), palpitations, pruritus, psychomotor agitation, second degree atrioventricular block, sinus congestion, skin rash, sore nose, sore throat, throat irritation, tongue edema, tremor, urticaria, voice disorder, vulvovaginal candidiasis, wheezing  Contraindications Hypersensitivity to fluticasone or any component of the formulation  OTC labeling: When used for self-medication, do not use in children <4 years (Clarispray, Flonase Allergy Relief, Good Sense Nasoflow) or children <2 years (Flonase Sensimist), for the treatment of asthma, or with current injury or surgery to nose that is not fully healed.  Documentation of allergenic  cross-reactivity for intranasal steroids is limited. However, because of similarities in chemical structure and/or pharmacologic actions, the possibility of cross-sensitivity cannot be ruled out with certainty.  [place] labeling: Additional contraindications (not in [place] labeling): Flonase: Untreated fungal, bacterial, or tuberculosis infections of the respiratory tract  Warnings/Precautions Concerns related to adverse effects:  . Adrenal suppression: May cause hypercorticism or suppression of hypothalamic-pituitary-adrenal (HPA) axis, particularly in younger children or in patients receiving high doses for prolonged periods. HPA axis suppression may lead to adrenal crisis. Withdrawal and discontinuation of a corticosteroid should be done slowly and carefully. Pediatric patients may be more susceptible to systemic toxicity. Particular care is required when patients are transferred from systemic corticosteroids to inhaled products due to possible adrenal insufficiency or withdrawal from steroids, including an increase in allergic symptoms. Patients receiving ?20 mg per day of prednisone (or equivalent) may be most susceptible. Concurrent use of ritonavir (and potentially other strong inhibitors of CYP3A4) may increase fluticasone levels and effects on HPA suppression.  . Delayed wound healing: Avoid nasal corticosteroid use in patients with recent nasal septal ulcers, nasal surgery, or nasal trauma until healing has occurred.  . Hypersensitivity: Hypersensitivity reactions (including anaphylaxis, angioedema, rash, contact dermatitis, and urticaria) have been reported; discontinue for severe reactions.  . Immunosuppression: Prolonged use of corticosteroids may also increase the incidence of secondary infection, mask acute infection (including fungal infections), prolong or exacerbate viral infections, or limit response to vaccines. Exposure to chickenpox and/or measles should be avoided, especially if not  immunized. Avoid use or use with caution in patients with latent/active tuberculosis, systemic fungal, bacterial, viral, or parasitic infections, or ocular herpes simplex.  . Local nasal effects: Nasal septal perforation, nasal ulceration, epistaxis, and localized Candida albicans infections of the nose and/or pharynx may occur. Monitor  patients periodically for adverse nasal effects; discontinuation of therapy may be necessary if an infection occurs.  Disease-related concerns:  . Hepatic impairment: Use with caution in patients with moderate to severe hepatic impairment (accumulation may occur); monitor patients closely.  . Infections: Use caution or avoid use in patients with active or latent tuberculosis or in patients with untreated fungal, bacterial, or systemic viral infections. Do not use in untreated localized infection involving the nasal mucosa; concurrent antimicrobial therapy should be administered if bacterial infection of the sinuses is suspected/confirmed.  . Ocular disease: Use with caution in patients with cataracts and/or glaucoma; increased intraocular pressure, open-angle glaucoma, and cataracts have occurred with prolonged use. Consider routine eye exams in long-term users or in patients who report visual changes.  Concurrent drug therapy issues:  . Drug-drug interactions: Potentially significant interactions may exist, requiring dose or frequency adjustment, additional monitoring, and/or selection of alternative therapy. Consult drug interactions database for more detailed information.

## 2016-05-21 NOTE — Telephone Encounter (Signed)
lmtcb x1 for pt. 

## 2016-05-25 NOTE — Telephone Encounter (Signed)
Patient aware of Dr. Golden Pop recommendations. Copy of information below mailed to patient per her request. Nothing further needed.

## 2016-06-07 ENCOUNTER — Other Ambulatory Visit: Payer: Self-pay | Admitting: Emergency Medicine

## 2016-07-06 ENCOUNTER — Other Ambulatory Visit: Payer: Self-pay | Admitting: Emergency Medicine

## 2016-09-08 ENCOUNTER — Telehealth: Payer: Self-pay

## 2016-09-08 NOTE — Telephone Encounter (Signed)
Patient states that Dr. Everlene Farrier is supposed to be writing her a letter to get her mail delivered to her house. Patient states she's having trouble going to the po box due to her age. Patient wants to know if that letter has been writin and if someone could call once completed. Please advise!  [phone]

## 2016-09-08 NOTE — Telephone Encounter (Signed)
Dr. Everlene Farrier do you recall wanting to do this for pt?

## 2016-09-09 ENCOUNTER — Other Ambulatory Visit: Payer: Self-pay | Admitting: Emergency Medicine

## 2016-09-09 NOTE — Telephone Encounter (Signed)
Please write letter that patient due to her age and medical condition she is not able to walk to her mailbox. The mail should be delivered to her door. Thanks

## 2016-09-10 NOTE — Telephone Encounter (Signed)
Letter written and in drawer for p/up. Notified pt ready.

## 2016-09-13 ENCOUNTER — Ambulatory Visit (INDEPENDENT_AMBULATORY_CARE_PROVIDER_SITE_OTHER): Payer: Medicare Other | Admitting: Emergency Medicine

## 2016-09-13 ENCOUNTER — Ambulatory Visit (INDEPENDENT_AMBULATORY_CARE_PROVIDER_SITE_OTHER): Payer: Medicare Other

## 2016-09-13 ENCOUNTER — Encounter: Payer: Self-pay | Admitting: Emergency Medicine

## 2016-09-13 VITALS — BP 118/62 | HR 85 | Temp 98.6°F | Resp 18 | Ht 63.0 in | Wt 125.0 lb

## 2016-09-13 DIAGNOSIS — Z23 Encounter for immunization: Secondary | ICD-10-CM | POA: Diagnosis not present

## 2016-09-13 DIAGNOSIS — M25551 Pain in right hip: Secondary | ICD-10-CM

## 2016-09-13 DIAGNOSIS — M5441 Lumbago with sciatica, right side: Secondary | ICD-10-CM

## 2016-09-13 MED ORDER — GABAPENTIN 100 MG PO CAPS: ORAL_CAPSULE | ORAL | 3 refills | Status: DC
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
## 2016-09-13 NOTE — Patient Instructions (Addendum)
Try an alternate heat packs with ice packs to your back every 3-4 hours. Take 1 extra strength Tylenol 500 mg every 8 hours for pain. Recheck one week if pain is persistent.    IF you received an x-ray today, you will receive an invoice from [facility]. Please contact [facility] at [phone] with questions or concerns regarding your invoice.   IF you received labwork today, you will receive an invoice from [employer]. Please contact [facility] at [phone] with questions or concerns regarding your invoice.   Our billing staff will not be able to assist you with questions regarding bills from these companies.  You will be contacted with the lab results as soon as they are available. The fastest way to get your results is to activate your My Chart account. Instructions are located on the last page of this paperwork. If you have not heard from [place] regarding the results in 2 weeks, please contact this office.

## 2016-09-22 ENCOUNTER — Telehealth: Payer: Self-pay

## 2016-09-22 ENCOUNTER — Other Ambulatory Visit: Payer: Self-pay | Admitting: Emergency Medicine

## 2016-09-22 ENCOUNTER — Telehealth: Payer: Self-pay | Admitting: Emergency Medicine

## 2016-09-22 MED ORDER — LOSARTAN POTASSIUM 50 MG PO TABS: 50.0000 mg | ORAL_TABLET | Freq: Every day | ORAL | 3 refills | Status: DC

## 2016-09-22 NOTE — Telephone Encounter (Signed)
Medication [facility]ordered [facility]  Attempted to reach patient, number busy

## 2016-09-22 NOTE — Telephone Encounter (Signed)
Patient moved,  Lost [place] in the transistion.    [facility] [address]

## 2016-11-15 ENCOUNTER — Ambulatory Visit (INDEPENDENT_AMBULATORY_CARE_PROVIDER_SITE_OTHER): Payer: Medicare Other | Admitting: Physician Assistant

## 2016-11-15 VITALS — BP 122/66 | HR 80 | Temp 98.3°F | Resp 16 | Ht 63.0 in | Wt 127.6 lb

## 2016-11-15 DIAGNOSIS — R8290 Unspecified abnormal findings in urine: Secondary | ICD-10-CM | POA: Diagnosis not present

## 2016-11-15 NOTE — Patient Instructions (Addendum)
Bring your urine back tomorrow and I will call you with the results.   Thank you for letting me participate in your health and well being.    IF you received an x-ray today, you will receive an invoice from [facility]. Please contact [facility] at [phone] with questions or concerns regarding your invoice.   IF you received labwork today, you will receive an invoice from [employer]. Please contact [facility] at [phone] with questions or concerns regarding your invoice.   Our billing staff will not be able to assist you with questions regarding bills from these companies.  You will be contacted with the lab results as soon as they are available. The fastest way to get your results is to activate your My Chart account. Instructions are located on the last page of this paperwork. If you have not heard from [place] regarding the results in 2 weeks, please contact this office.
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
## 2016-11-16 LAB — POCT URINALYSIS DIP (MANUAL ENTRY)
BILIRUBIN UA: NEGATIVE
BILIRUBIN UA: NEGATIVE
GLUCOSE UA: NEGATIVE
Nitrite, UA: NEGATIVE
Protein Ur, POC: NEGATIVE
RBC UA: NEGATIVE
SPEC GRAV UA: 1.01
Urobilinogen, UA: 0.2
pH, UA: 6.5

## 2016-11-16 LAB — POC MICROSCOPIC URINALYSIS (UMFC): MUCUS RE: ABSENT

## 2016-11-16 NOTE — Addendum Note (Signed)
Addended by: Jannette Spanner on: 11/16/2016 05:01 PM   Modules accepted: Orders

## 2016-11-16 NOTE — Addendum Note (Signed)
Addended by: Jannette Spanner on: 11/16/2016 04:58 PM   Modules accepted: Orders

## 2016-11-17 LAB — URINE CULTURE

## 2016-11-22 ENCOUNTER — Other Ambulatory Visit: Payer: Self-pay | Admitting: Physician Assistant

## 2016-11-22 MED ORDER — CEPHALEXIN 500 MG PO CAPS: 500.0000 mg | ORAL_CAPSULE | Freq: Two times a day (BID) | ORAL | 0 refills | Status: AC

## 2016-11-22 NOTE — Progress Notes (Signed)
Meds ordered this encounter  Medications  . cephALEXin (KEFLEX) 500 MG capsule    Sig: Take 1 capsule (500 mg total) by mouth 2 (two) times daily.    Dispense:  14 capsule    Refill:  0    Order Specific Question:   Supervising Provider    Answer:   Wardell Honour [[id number]]    Pt called and instructed that she was having a UTI. Will treat accordingly.

## 2016-11-26 ENCOUNTER — Telehealth: Payer: Self-pay

## 2016-11-26 MED ORDER — NYSTATIN 100000 UNIT/ML MT SUSP: 5.0000 mL | Freq: Four times a day (QID) | OROMUCOSAL | 0 refills | Status: DC

## 2016-11-26 NOTE — Telephone Encounter (Signed)
Pt seen and put on keflex for uti, smell is better but now has white coat on toungue, no pain or swelling she thinks its thrush. Can she get a med called in? She is 89 and its hard to get in here.

## 2016-11-26 NOTE — Telephone Encounter (Signed)
Pt advised of med and f/u as needed

## 2016-11-26 NOTE — Telephone Encounter (Signed)
I will treat with her nystatin but if she is not getting better she will need to be rechecked

## 2017-07-13 ENCOUNTER — Telehealth: Payer: Self-pay | Admitting: Physician Assistant

## 2017-07-13 NOTE — Telephone Encounter (Signed)
Pt is requesting a handicap parking permit.  Contact number [phone]

## 2017-07-14 NOTE — Telephone Encounter (Signed)
Please advise 

## 2017-07-15 NOTE — Telephone Encounter (Signed)
She will need to provide [place] with the paperwork from the [facility] requesting the permit. Please have her schedule and OV for this. Thanks!

## 2017-07-18 ENCOUNTER — Ambulatory Visit (INDEPENDENT_AMBULATORY_CARE_PROVIDER_SITE_OTHER): Payer: Medicare Other | Admitting: Physician Assistant

## 2017-07-18 ENCOUNTER — Encounter: Payer: Self-pay | Admitting: Physician Assistant

## 2017-07-18 VITALS — BP 160/70 | HR 58 | Temp 97.5°F | Resp 18 | Ht 63.03 in | Wt 126.0 lb

## 2017-07-18 DIAGNOSIS — R262 Difficulty in walking, not elsewhere classified: Secondary | ICD-10-CM | POA: Diagnosis not present

## 2017-07-18 DIAGNOSIS — Z9989 Dependence on other enabling machines and devices: Secondary | ICD-10-CM

## 2017-07-18 NOTE — Patient Instructions (Addendum)
Please make appointment for your annual physical exam next week. Thank you for letting me participate in your health and well being.  IF you received an x-ray today, you will receive an invoice from [facility]. Please contact [facility] at [phone] with questions or concerns regarding your invoice.   IF you received labwork today, you will receive an invoice from [place]. Please contact [facility] at [phone] with questions or concerns regarding your invoice.   Our billing staff will not be able to assist you with questions regarding bills from these companies.  You will be contacted with the lab results as soon as they are available. The fastest way to get your results is to activate your My Chart account. Instructions are located on the last page of this paperwork. If you have not heard from [place] regarding the results in 2 weeks, please contact this office.

## 2017-07-18 NOTE — Progress Notes (Deleted)
Presents today for [employer] Visit-Subsequent.   Date of last exam: ***  Interpreter used for this visit? {Yes/No-Ex:120004}  Patient Care Team: Darlyne Russian, MD as PCP - General (Family Medicine) Lorretta Harp, MD (Cardiology) Danella Sensing, MD (Dermatology) Dian Queen, MD (Obstetrics and Gynecology) Calvert Cantor, MD (Ophthalmology)   Other items to address today: {NONE DEFAULTED:18576::"none"} ***  Cancer Screening: Cervical: {Yes/No-Ex:120004} Breast: {Yes/No-Ex:120004} Colon: {Yes/No-Ex:120004}  Prostate: {Yes/No-Ex:120004}   Other Screening: Last screening for diabetes: *** Last lipid screening: ***  ADVANCE DIRECTIVES: Discussed: {Yes/No-Ex:120004} Patient desires CPR ({YES/NO:21197}), mechanical ventilation ({YES/NO:21197}), prolonged artificial support (may include mechanical ventilation, tube/PEG feeding, etc) ({YES/NO:21197}). On File: {Yes/No-Ex:120004} Materials Provided: {Yes/No-Ex:120004}  Immunization status:  Immunization History  Administered Date(s) Administered  . Influenza,inj,Quad PF,36+ Mos 09/21/2015, 09/13/2016  . Pneumococcal-Unspecified 12/28/1991  . Td 09/26/1998     Health Maintenance Due  Topic Date Due  . PNA vac Low Risk Adult (2 of 2 - PCV13) 12/27/1992  . TETANUS/TDAP  09/26/2008     Functional Status Survey: Is the patient deaf or have difficulty hearing?: Yes (right ear hearing aide) Does the patient have difficulty seeing, even when wearing glasses/contacts?: No Does the patient have difficulty concentrating, remembering, or making decisions?: Yes Does the patient have difficulty walking or climbing stairs?: Yes Does the patient have difficulty dressing or bathing?: No Does the patient have difficulty doing errands alone such as visiting a doctor's office or shopping?: Yes   Home Environment: ***  Urinary Incontinence Screening: ***  Patient Active Problem List   Diagnosis Date  Noted  . Post-nasal drip 05/05/2016  . Irritable larynx 05/05/2016  . Cough 09/20/2014  . Bronchiectasis without complication ([place]) 65/68/1275  . History of hemoptysis 09/20/2014  . History of positive PPD, untreated 09/20/2014  . Hemoptysis 08/27/2014  . GERD (gastroesophageal reflux disease)   . Hypertension      Past Medical History:  Diagnosis Date  . Bronchiectasis ([place])   . Cataract   . Cervical disc disease   . GERD (gastroesophageal reflux disease)   . Hypertension   . MVP (mitral valve prolapse)   . Osteoporosis   . Positive PPD   . Pyelonephritis 06/2011  . Thyroid disease      Past Surgical History:  Procedure Laterality Date  . ABDOMINAL HYSTERECTOMY    . cataract surgery     right  . CESAREAN SECTION    . CHOLECYSTECTOMY    . EYE SURGERY    . PARTIAL HYSTERECTOMY     fibroid left ovary  . TONSILLECTOMY AND ADENOIDECTOMY       Family History  Problem Relation Age of Onset  . Diabetes Father   . Thyroid disease Sister   . Thyroid disease Brother   . Heart disease Mother   . Cancer Maternal Grandfather      Social History   Social History  . Marital status: Widowed    Spouse name: N/A  . Number of children: N/A  . Years of education: N/A   Occupational History  . Not on file.   Social History Main Topics  . Smoking status: Never Smoker  . Smokeless tobacco: Never Used  . Alcohol use No  . Drug use: No  . Sexual activity: No   Other Topics Concern  . Not on file   Social History Narrative  . No narrative on file     Allergies  Allergen Reactions  . Latex Itching     Prior  to Admission medications   Medication Sig Start Date End Date Taking? Authorizing Provider  Calcium Carbonate-Vitamin D (CALTRATE 600+D PO) Take by mouth 2 (two) times daily.    Yes [provider]  cholecalciferol (VITAMIN D) 1000 UNITS tablet Take 1,000 Units by mouth daily.   Yes [provider]  losartan (COZAAR) 50 MG tablet Take  1 tablet (50 mg total) by mouth daily. 09/22/16  Yes Darlyne Russian, MD  magnesium 30 MG tablet Take 30 mg by mouth 2 (two) times daily.   Yes [provider]  Multiple Vitamin (MULTIVITAMIN) tablet Take 1 tablet by mouth daily.   Yes [provider]  nystatin (MYCOSTATIN) 100000 UNIT/ML suspension Take 5 mLs (500,000 Units total) by mouth 4 (four) times daily. Swish in mouth and then swallow 11/26/16  Yes Mittie Bodo     Depression screen [facility] 2/9 07/18/2017 11/15/2016 09/13/2016 11/12/2015 09/21/2015  Decreased Interest 0 0 0 0 0  Down, Depressed, Hopeless 0 0 0 0 0  PHQ - 2 Score 0 0 0 0 0     Fall Risk  07/18/2017 11/15/2016 09/13/2016 01/07/2015 03/05/2014  Falls in the past year? No No No No Yes  Number falls in past yr: - - - - 2 or more  Risk for fall due to : - - - - Other (Comment)      PHYSICAL EXAM: BP (!) 160/70 (BP Location: Right Arm, Patient Position: Sitting, Cuff Size: Normal)   Pulse (!) 58   Temp (!) 97.5 F (36.4 C) (Oral)   Resp 18   Ht 5' 3.03" (1.601 m)   Wt 126 lb (57.2 kg)   SpO2 97%   BMI 22.30 kg/m    Wt Readings from Last 3 Encounters:  07/18/17 126 lb (57.2 kg)  11/15/16 127 lb 9.6 oz (57.9 kg)  09/13/16 125 lb (56.7 kg)      Visual Acuity Screening   Right eye Left eye Both eyes  Without correction:     With correction: 20/40 20/40 20/50       Physical Exam   Education/Counseling provided regarding diet and exercise, prevention of chronic diseases, smoking/tobacco cessation, if applicable, and reviewed "Covered Medicare Preventive Services."   ASSESSMENT/PLAN: ***

## 2017-07-18 NOTE — Progress Notes (Signed)
   Shelly Morse  MRN: [id number] DOB: 02-11-1922  Subjective:  Shelly Morse is a 83 y.o. female seen in office today for a chief complaint of need of disability parking paperwork filled out. Of note, pt gave up driving a few years ago and had a chauffeur that drives her to and from appointments. She uses a cane as assistance to ambulate and cannot walk further than 200 feet without having to stop to take a breath. No hx of heart disease. Has no other questions or concerns.  Review of Systems  Per HPI  Patient Active Problem List   Diagnosis Date Noted  . Post-nasal drip 05/05/2016  . Irritable larynx 05/05/2016  . Cough 09/20/2014  . Bronchiectasis without complication ([place]) 02/72/5366  . History of hemoptysis 09/20/2014  . History of positive PPD, untreated 09/20/2014  . Hemoptysis 08/27/2014  . GERD (gastroesophageal reflux disease)   . Hypertension     Current Outpatient Prescriptions on File Prior to Visit  Medication Sig Dispense Refill  . Calcium Carbonate-Vitamin D (CALTRATE 600+D PO) Take by mouth 2 (two) times daily.     . cholecalciferol (VITAMIN D) 1000 UNITS tablet Take 1,000 Units by mouth daily.    Marland Kitchen losartan (COZAAR) 50 MG tablet Take 1 tablet (50 mg total) by mouth daily. 90 tablet 3  . magnesium 30 MG tablet Take 30 mg by mouth 2 (two) times daily.    . Multiple Vitamin (MULTIVITAMIN) tablet Take 1 tablet by mouth daily.    Marland Kitchen nystatin (MYCOSTATIN) 100000 UNIT/ML suspension Take 5 mLs (500,000 Units total) by mouth 4 (four) times daily. Swish in mouth and then swallow 60 mL 0   No current facility-administered medications on file prior to visit.     Allergies  Allergen Reactions  . Latex Itching     Objective:  BP (!) 160/70 (BP Location: Right Arm, Patient Position: Sitting, Cuff Size: Normal)   Pulse (!) 58   Temp (!) 97.5 F (36.4 C) (Oral)   Resp 18   Ht 5' 3.03" (1.601 m)   Wt 126 lb (57.2 kg)   SpO2 97%   BMI 22.30 kg/m   Physical Exam    Constitutional: She is oriented to person, place, and time and well-developed, well-nourished, and in no distress.  HENT:  Head: Normocephalic and atraumatic.  Eyes: Conjunctivae are normal.  Neck: Normal range of motion.  Pulmonary/Chest: Effort normal.  Neurological: She is alert and oriented to person, place, and time.  Uses cane to ambulate.  Skin: Skin is warm and dry.  Psychiatric: Affect normal.  Vitals reviewed.   Assessment and Plan :  1. Dependence on walking stick Disability parking paperwork completed and given to pt.   Tenna Delaine, PA-C  Primary Care at [facility] 07/18/2017 2:05 PM

## 2017-07-27 NOTE — Progress Notes (Signed)
Subjective:   Chief Complaint  Patient presents with  . Annual Exam    AWV     Shelly Morse is a 83 y.o. female who presents for Medicare Annual/Subsequent preventive examination.  Last CPE 12/2014  Preventive Screening-Counseling & Management  Tobacco History  Smoking Status  . Never Smoker  Smokeless Tobacco  . Never Used     Problems Prior to Visit 1. Constipation: Controlled with milk of magnesia 2. Arthralgias: fingers, kyphotic, uses cane to walk 3. HTN: On losartan 13m 4.  eGFR 59 5.  Mild thrombocytosis with last last 08/2015 6.  ?Subclinical hypothyroidism -  Lab Results  Component Value Date   TSH 5.471 (H) 01/07/2015   TSH 3.633 01/31/2014   No prior lipid panel.  Current Problems (verified) Patient Active Problem List   Diagnosis Date Noted  . Post-nasal drip 05/05/2016  . Irritable larynx 05/05/2016  . Cough 09/20/2014  . Bronchiectasis without complication ([place] 063/87/5643 . History of hemoptysis 09/20/2014  . History of positive PPD, untreated 09/20/2014  . Hemoptysis 08/27/2014  . GERD (gastroesophageal reflux disease)   . Hypertension     Medications Prior to Visit Current Outpatient Prescriptions on File Prior to Visit  Medication Sig Dispense Refill  . Calcium Carbonate-Vitamin D (CALTRATE 600+D PO) Take by mouth 2 (two) times daily.     . cholecalciferol (VITAMIN D) 1000 UNITS tablet Take 1,000 Units by mouth daily.    .Marland Kitchenlosartan (COZAAR) 50 MG tablet Take 1 tablet (50 mg total) by mouth daily. 90 tablet 3  . magnesium 30 MG tablet Take 30 mg by mouth 2 (two) times daily.    . Multiple Vitamin (MULTIVITAMIN) tablet Take 1 tablet by mouth daily.    .Marland Kitchennystatin (MYCOSTATIN) 100000 UNIT/ML suspension Take 5 mLs (500,000 Units total) by mouth 4 (four) times daily. Swish in mouth and then swallow 60 mL 0   No current facility-administered medications on file prior to visit.     Current Medications (verified) Current Outpatient  Prescriptions  Medication Sig Dispense Refill  . Calcium Carbonate-Vitamin D (CALTRATE 600+D PO) Take by mouth 2 (two) times daily.     . cholecalciferol (VITAMIN D) 1000 UNITS tablet Take 1,000 Units by mouth daily.    .Marland Kitchenlosartan (COZAAR) 50 MG tablet Take 1 tablet (50 mg total) by mouth daily. 90 tablet 3  . magnesium 30 MG tablet Take 30 mg by mouth 2 (two) times daily.    . Multiple Vitamin (MULTIVITAMIN) tablet Take 1 tablet by mouth daily.    .Marland Kitchennystatin (MYCOSTATIN) 100000 UNIT/ML suspension Take 5 mLs (500,000 Units total) by mouth 4 (four) times daily. Swish in mouth and then swallow 60 mL 0   No current facility-administered medications for this visit.      Allergies (verified) Latex   PAST HISTORY  Family History Family History  Problem Relation Age of Onset  . Diabetes Father   . Thyroid disease Sister   . Thyroid disease Brother   . Heart disease Mother   . Cancer Maternal Grandfather     Social History Social History  Substance Use Topics  . Smoking status: Never Smoker  . Smokeless tobacco: Never Used  . Alcohol use No     Are there smokers in your home (other than you)? No - lives alone  Risk Factors Current exercise habits: The patient does not participate in regular exercise at present.  Dietary issues discussed: Meals on Wheels   Cardiac risk factors: advanced  age (older than 19 for men, 31 for women), hypertension and sedentary lifestyle.  Depression Screen Depression screen [facility] 2/9 07/28/2017 07/18/2017 11/15/2016 09/13/2016 11/12/2015  Decreased Interest 0 0 0 0 0  Down, Depressed, Hopeless 0 0 0 0 0  PHQ - 2 Score 0 0 0 0 0     Activities of Daily Living  In your present state of health, do you have any difficulty performing the following activities?: Plays the piano but has stopped recently. Driving? Yes Burnett Harry 3d/wk - checks on her and caretakers Managing money?  No Feeding yourself? No Getting from bed to chair? No Climbing a  flight of stairs? Yes - was told not to go  Preparing food and eating?: Yes Bathing or showering? No - has shower chair and grab bar Getting dressed: No Getting to the toilet? No Using the toilet:No - chronic constipation treated with laxative qod from Dr. Everlene Farrier Moving around from place to place: No In the past year have you fallen or had a near fall?:No   Are you sexually active?  No  Do you have more than one partner?  No  Hearing Difficulties: Yes  Uses hearing aid at left side, however with difficulty hearing bilaterally. Do you often ask people to speak up or repeat themselves? Yes Do you experience ringing or noises in your ears? No Do you have difficulty understanding soft or whispered voices? Yes   Do you feel that you have a problem with memory? Yes  Do you often misplace items? No  Do you feel safe at home?  Yes  Cognitive Testing  Alert? Yes  Normal Appearance?Yes  Oriented to person? Yes  Place? Yes   Time? Yes  Displays appropriate judgment?Yes   Advanced Directives have been discussed with the patient? Yes HCPOA is her niece Barri Neidlinger Rohorar. Younger son is in prison and won't be out for a while. Older son doesn't manage his $$ well.   List the Names of Other Physician/Practitioners you currently use: 1.   Patient Care Team: Darlyne Russian, MD as PCP - General (Family Medicine) Lorretta Harp, MD (Cardiology) Danella Sensing, MD (Dermatology) Dian Queen, MD (Obstetrics and Gynecology) Calvert Cantor, MD (Ophthalmology)   Indicate any recent Medical Services you may have received from other than [facility] providers in the past year (date may be approximate).  Immunization History  Administered Date(s) Administered  . Influenza,inj,Quad PF,36+ Mos 09/21/2015, 09/13/2016  . Pneumococcal-Unspecified 12/28/1991  . Td 09/26/1998    Screening Tests Health Maintenance  Topic Date Due  . PNA vac Low Risk Adult (2 of 2 - PCV13) 12/27/1992  .  TETANUS/TDAP  09/26/2008  . INFLUENZA VACCINE  07/27/2017  . DEXA SCAN  Completed    All answers were reviewed with the patient and necessary referrals were made:  Nayleen Janosik, MD   07/27/2017   History reviewed: allergies, current medications, past family history, past medical history, past social history, past surgical history and problem list  Review of Systems Pertinent items are noted in HPI.    Objective:   BP (!) 142/72   Pulse 72   Temp 98 F (36.7 C) (Oral)   Resp 18   Ht 5' 3.03" (1.601 m)   Wt 128 lb 9.6 oz (58.3 kg)   SpO2 96%   BMI 22.76 kg/m     BP (!) 142/72   Pulse 72   Temp 98 F (36.7 C) (Oral)   Resp 18   Ht 5' 3.03" (  1.601 m)   Wt 128 lb 9.6 oz (58.3 kg)   SpO2 96%   BMI 22.76 kg/m   General Appearance:    Alert, cooperative, no distress, appears stated age  Head:    Normocephalic, without obvious abnormality, atraumatic  Eyes:    PERRL, conjunctiva/corneas clear, EOM's intact, fundi    benign, both eyes  Ears:    Normal TM's and external ear canals, both ears  Nose:   Nares normal, septum midline, mucosa normal, no drainage    or sinus tenderness  Throat:   Lips, mucosa, and tongue normal; teeth and gums normal  Neck:   Supple, symmetrical, trachea midline, no adenopathy;    thyroid:  no enlargement/tenderness/nodules; no carotid   bruit or JVD  Back:     Symmetric, no curvature, ROM normal, no CVA tenderness  Lungs:     Clear to auscultation bilaterally, respirations unlabored  Chest Wall:    No tenderness or deformity   Heart:    Regular rate and rhythm, S1 and S2 normal, no murmur, rub   or gallop  Breast Exam:    No tenderness, masses, or nipple abnormality  Abdomen:     Soft, non-tender, bowel sounds active all four quadrants,    no masses, no organomegaly  Genitalia:    Normal female without lesion, discharge or tenderness  Rectal:    Normal tone, normal prostate, no masses or tenderness;   guaiac negative stool  Extremities:    Extremities normal, atraumatic, no cyanosis or edema  Pulses:   2+ and symmetric all extremities  Skin:   Skin color, texture, turgor normal, no rashes or lesions  Lymph nodes:   Cervical, supraclavicular, and axillary nodes normal  Neurologic:   CNII-XII intact, normal strength, sensation and reflexes    throughout       Assessment:     1. Medicare annual wellness visit, subsequent   2. Essential hypertension   3. Medication monitoring encounter   4. Abnormal TSH   5. Thrombocytosis ([place])         Plan:  Ua, cbc, cmp, thyroid panel prevnar-13 - did get very ill from prior vaccine.  Took the flu shot last year and did not do well with it.    During the course of the visit the patient was educated and counseled about appropriate screening and preventive services including:    Declined shingles vaccine prior.  DEXA 03/30/11 nml  Diet review for nutrition referral? Yes ____  Not Indicated ____    Orders Placed This Encounter  Procedures  . CBC  . Comprehensive metabolic panel  . Thyroid Panel With TSH  . POCT urinalysis dipstick    Standing Status:   Future    Standing Expiration Date:   07/28/2018    Delman Cheadle, M.D.  Primary Care at [facility] [place], [place] [address] [phone] phone [phone] fax  07/29/17 1:32 AM  Patient Instructions (the written plan) was given to the patient.  Medicare Attestation I have personally reviewed: The patient's medical and social history Their use of alcohol, tobacco or illicit drugs Their current medications and supplements The patient's functional ability including ADLs,fall risks, home safety risks, cognitive, and hearing and visual impairment Diet and physical activities Evidence for depression or mood disorders  The patient's weight, height, BMI, and visual acuity have been recorded in the chart.  I have made referrals, counseling, and provided education to the patient based on review of  the above and I have provided the patient with a written personalized care plan for preventive services.     Delman Cheadle, MD   07/27/2017

## 2017-07-28 ENCOUNTER — Encounter: Payer: Self-pay | Admitting: Family Medicine

## 2017-07-28 ENCOUNTER — Ambulatory Visit (INDEPENDENT_AMBULATORY_CARE_PROVIDER_SITE_OTHER): Payer: Medicare Other | Admitting: Family Medicine

## 2017-07-28 VITALS — BP 142/72 | HR 72 | Temp 98.0°F | Resp 18 | Ht 63.03 in | Wt 128.6 lb

## 2017-07-28 DIAGNOSIS — R946 Abnormal results of thyroid function studies: Secondary | ICD-10-CM | POA: Diagnosis not present

## 2017-07-28 DIAGNOSIS — D473 Essential (hemorrhagic) thrombocythemia: Secondary | ICD-10-CM

## 2017-07-28 DIAGNOSIS — Z5181 Encounter for therapeutic drug level monitoring: Secondary | ICD-10-CM | POA: Diagnosis not present

## 2017-07-28 DIAGNOSIS — I10 Essential (primary) hypertension: Secondary | ICD-10-CM | POA: Diagnosis not present

## 2017-07-28 DIAGNOSIS — D75839 Thrombocytosis, unspecified: Secondary | ICD-10-CM

## 2017-07-28 DIAGNOSIS — Z0000 Encounter for general adult medical examination without abnormal findings: Secondary | ICD-10-CM

## 2017-07-28 DIAGNOSIS — R7989 Other specified abnormal findings of blood chemistry: Secondary | ICD-10-CM

## 2017-07-28 NOTE — Patient Instructions (Signed)
     IF you received an x-ray today, you will receive an invoice from [facility]. Please contact [facility] at [phone] with questions or concerns regarding your invoice.   IF you received labwork today, you will receive an invoice from [facility]. Please contact [facility] at [phone] with questions or concerns regarding your invoice.   Our billing staff will not be able to assist you with questions regarding bills from these companies.  You will be contacted with the lab results as soon as they are available. The fastest way to get your results is to activate your My Chart account. Instructions are located on the last page of this paperwork. If you have not heard from us regarding the results in 2 weeks, please contact this office.     

## 2017-07-29 ENCOUNTER — Encounter: Payer: Self-pay | Admitting: *Deleted

## 2017-07-29 LAB — COMPREHENSIVE METABOLIC PANEL
A/G RATIO: 1.2 (ref 1.2–2.2)
ALBUMIN: 4.2 g/dL (ref 3.2–4.6)
ALT: 10 IU/L (ref 0–32)
AST: 25 IU/L (ref 0–40)
Alkaline Phosphatase: 78 IU/L (ref 39–117)
BILIRUBIN TOTAL: 1.1 mg/dL (ref 0.0–1.2)
BUN / CREAT RATIO: 20 (ref 12–28)
BUN: 19 mg/dL (ref 10–36)
CHLORIDE: 102 mmol/L (ref 96–106)
CO2: 26 mmol/L (ref 20–29)
Calcium: 9.5 mg/dL (ref 8.7–10.3)
Creatinine, Ser: 0.93 mg/dL (ref 0.57–1.00)
GFR calc Af Amer: 62 mL/min/{1.73_m2} (ref >59)
GFR calc non Af Amer: 54 mL/min/{1.73_m2} — ABNORMAL LOW (ref >59)
Globulin, Total: 3.4 g/dL (ref 1.5–4.5)
Glucose: 80 mg/dL (ref 65–99)
POTASSIUM: 3.8 mmol/L (ref 3.5–5.2)
Sodium: 141 mmol/L (ref 134–144)
TOTAL PROTEIN: 7.6 g/dL (ref 6.0–8.5)

## 2017-07-29 LAB — CBC
Hematocrit: 39.2 % (ref 34.0–46.6)
Hemoglobin: 12.5 g/dL (ref 11.1–15.9)
MCH: 27.5 pg (ref 26.6–33.0)
MCHC: 31.9 g/dL (ref 31.5–35.7)
MCV: 86 fL (ref 79–97)
PLATELETS: 183 10*3/uL (ref 150–379)
RBC: 4.54 x10E6/uL (ref 3.77–5.28)
RDW: 14.8 % (ref 12.3–15.4)
WBC: 6.5 10*3/uL (ref 3.4–10.8)

## 2017-07-29 LAB — THYROID PANEL WITH TSH
Free Thyroxine Index: 1.8 (ref 1.2–4.9)
T3 Uptake Ratio: 26 % (ref 24–39)
T4, Total: 6.9 ug/dL (ref 4.5–12.0)
TSH: 6.29 u[IU]/mL — ABNORMAL HIGH (ref 0.450–4.500)

## 2017-07-29 MED ORDER — LOSARTAN POTASSIUM 50 MG PO TABS: 50.0000 mg | ORAL_TABLET | Freq: Every day | ORAL | 3 refills | Status: DC

## 2017-08-03 LAB — POCT URINALYSIS DIP (MANUAL ENTRY)
BILIRUBIN UA: NEGATIVE
Glucose, UA: NEGATIVE mg/dL
Ketones, POC UA: NEGATIVE mg/dL
Nitrite, UA: POSITIVE — AB
Spec Grav, UA: 1.015 (ref 1.010–1.025)
Urobilinogen, UA: 0.2 E.U./dL
pH, UA: 7.5 (ref 5.0–8.0)

## 2017-08-03 NOTE — Addendum Note (Signed)
Addended by: Dierdre Searles on: 08/03/2017 06:19 PM   Modules accepted: Orders

## 2017-08-08 ENCOUNTER — Telehealth: Payer: Self-pay

## 2017-08-08 MED ORDER — CEPHALEXIN 500 MG PO CAPS
500.0000 mg | ORAL_CAPSULE | Freq: Two times a day (BID) | ORAL | 0 refills | Status: DC
Start: 2017-08-08 — End: 2018-07-30

## 2017-08-08 NOTE — Telephone Encounter (Signed)
Spoke with patient and advised that medication was sent to her pharmacy and that she should start on that tonight. Advised that if she didn't see any improvement come Thursday that she should come into the office for further evaluation. Patient verbalized understanding.

## 2017-08-08 NOTE — Telephone Encounter (Signed)
Patient called and acquiring about her lab results from her urine test. She left a letter with you last Saturday and she wants to know what to do. She stated that she has what looks puss in her urine and wants to know rather an antibiotic should be called in or what. Please advise.

## 2017-08-08 NOTE — Telephone Encounter (Signed)
Please give patient my apologies. Because the urine that was run in house and not sent out but not during a visit, I did not see it. Somehow the in-house labs do not come to our inbox. (we got the letter but couldn't read her hand-writing so I don't remember what happened with it.) Yes it definitely looks like she needs an antibiotic and I was and when into her pharmacy now. If she is not noticing significant improvement within 2 days, recheck with me on Thursday in office visit.

## 2018-04-26 ENCOUNTER — Encounter: Payer: Self-pay | Admitting: Physician Assistant

## 2018-04-26 ENCOUNTER — Other Ambulatory Visit: Payer: Self-pay

## 2018-04-26 ENCOUNTER — Ambulatory Visit: Payer: Medicare Other | Admitting: Physician Assistant

## 2018-04-26 VITALS — BP 150/70 | HR 77 | Temp 98.2°F | Resp 16 | Ht 62.99 in | Wt 109.2 lb

## 2018-04-26 DIAGNOSIS — I10 Essential (primary) hypertension: Secondary | ICD-10-CM

## 2018-04-26 DIAGNOSIS — B029 Zoster without complications: Secondary | ICD-10-CM

## 2018-04-26 DIAGNOSIS — R21 Rash and other nonspecific skin eruption: Secondary | ICD-10-CM | POA: Diagnosis not present

## 2018-04-26 MED ORDER — VALACYCLOVIR HCL 1 G PO TABS: 1000.0000 mg | ORAL_TABLET | Freq: Three times a day (TID) | ORAL | 0 refills | Status: AC

## 2018-04-26 NOTE — Progress Notes (Signed)
ROBEN TATSCH  MRN: [id number] DOB: 1922-09-14  Subjective:     Shelly Morse is a 84 y.o. female who presents for evaluation of a rash involving the skin above right upper eyelid. Rash started 6 days ago. Lesions are red, and raised in texture. Rash has improved over time. No new lesions. Rash is painful and is pruritic. Started out feeling tingling and burning. Associated symptoms: none. Patient denies: eye involvement, abdominal pain, arthralgia, congestion, cough, crankiness, decrease in appetite, decrease in energy level, fever, headache, irritability, myalgia, nausea, sore throat and vomiting. Patient has not had contacts with similar rash. Patient has not had new exposures (soaps, lotions, laundry detergents, foods, medications, plants, insects or animals). Has tried neosporin with no relief. Does not know if she has had shingles vaccine.   Review of Systems  Constitutional: Negative for chills and fever.  Eyes: Negative for photophobia, pain, discharge, redness, itching and visual disturbance.  Respiratory: Negative for cough and shortness of breath.   Cardiovascular: Negative for chest pain, palpitations and leg swelling.  Genitourinary: Negative for hematuria.  Neurological: Negative for dizziness and light-headedness.    Patient Active Problem List   Diagnosis Date Noted  . Post-nasal drip 05/05/2016  . Irritable larynx 05/05/2016  . Bronchiectasis without complication ([place]) 24/08/7352  . History of hemoptysis 09/20/2014  . History of positive PPD, untreated 09/20/2014  . GERD (gastroesophageal reflux disease)   . Hypertension     Current Outpatient Medications on File Prior to Visit  Medication Sig Dispense Refill  . amoxicillin (AMOXIL) 500 MG capsule Take 500 mg by mouth as needed (dental appt).    . Calcium Carbonate-Vitamin D (CALTRATE 600+D PO) Take by mouth 2 (two) times daily.     Marland Kitchen ibuprofen (ADVIL) 200 MG tablet Take 200 mg by mouth every 6 (six) hours as  needed.    Marland Kitchen losartan (COZAAR) 50 MG tablet Take 1 tablet (50 mg total) by mouth daily. 90 tablet 3  . magnesium 30 MG tablet Take 30 mg by mouth 2 (two) times daily.    . Multiple Vitamin (MULTIVITAMIN) tablet Take 1 tablet by mouth daily.    . cephALEXin (KEFLEX) 500 MG capsule Take 1 capsule (500 mg total) by mouth 2 (two) times daily. 20 capsule 0  . cholecalciferol (VITAMIN D) 1000 UNITS tablet Take 1,000 Units by mouth daily.     No current facility-administered medications on file prior to visit.     Allergies  Allergen Reactions  . Latex Itching      Social History   Socioeconomic History  . Marital status: Widowed    Spouse name: Not on file  . Number of children: 2  . Years of education: Not on file  . Highest education level: Not on file  Occupational History  . Not on file  Social Needs  . Financial resource strain: Not on file  . Food insecurity:    Worry: Not on file    Inability: Not on file  . Transportation needs:    Medical: Not on file    Non-medical: Not on file  Tobacco Use  . Smoking status: Never Smoker  . Smokeless tobacco: Never Used  Substance and Sexual Activity  . Alcohol use: No  . Drug use: No  . Sexual activity: Never  Lifestyle  . Physical activity:    Days per week: Not on file    Minutes per session: Not on file  . Stress: Not on file  Relationships  .  Social connections:    Talks on phone: Not on file    Gets together: Not on file    Attends religious service: Not on file    Active member of club or organization: Not on file    Attends meetings of clubs or organizations: Not on file    Relationship status: Not on file  . Intimate partner violence:    Fear of current or ex partner: Not on file    Emotionally abused: Not on file    Physically abused: Not on file    Forced sexual activity: Not on file  Other Topics Concern  . Not on file  Social History Narrative  . Not on file    Objective:  BP (!) 153/72 (BP Location:  Right Arm, Patient Position: Sitting, Cuff Size: Normal)   Pulse 77   Temp 98.2 F (36.8 C) (Oral)   Resp 16   Ht 5' 2.99" (1.6 m)   Wt 109 lb 3.2 oz (49.5 kg)   SpO2 96%   BMI 19.35 kg/m   Physical Exam  Constitutional: She is oriented to person, place, and time. She appears well-developed and well-nourished.  HENT:  Head: Normocephalic and atraumatic.  Eyes: Pupils are equal, round, and reactive to light. Conjunctivae, EOM and lids are normal. Right eye exhibits no discharge. Left eye exhibits no discharge.  Neck: Normal range of motion.  Cardiovascular: Normal rate, regular rhythm, normal heart sounds and intact distal pulses.  Pulmonary/Chest: Effort normal and breath sounds normal.  Musculoskeletal:       Right lower leg: She exhibits no swelling.       Left lower leg: She exhibits no swelling.  Neurological: She is alert and oriented to person, place, and time.  Skin: Skin is warm and dry. Rash (small cluster of erythematous papulse in right V1 dermatome just superior to right eyebrow, no eyelid or eye involvment) noted.  Psychiatric: She has a normal mood and affect.  Vitals reviewed.   Assessment and Plan :  1. Herpes zoster without complication 2. Rash and nonspecific skin eruption History and physical exam findings consistent with herpes zoster.  Although rash has been present for greater than 72 hours, due to location, will treat with Valtrex at this time.  There is no eye involvement noted and no new lesions have erupted since rash appeared 6 days ago, which is reassuring.  Patient given strict ED/return precautions. - valACYclovir (VALTREX) 1000 MG tablet; Take 1 tablet (1,000 mg total) by mouth 3 (three) times daily for 7 days.  Dispense: 21 tablet; Refill: 0  3. Essential hypertension BP elevated in office. Likely due to the fact that patient has not taken her blood pressure today and rash is painful. She is otherwise asymptomatic.  Encouraged patient to take her  blood pressure medication.  Instructed to check bp outside of office over the next couple of weeks. Return if consistently >140/90. Given strict ED precautions.    Tenna Delaine PA-C  Primary Care at [facility] 04/26/2018 10:30 AM

## 2018-04-26 NOTE — Patient Instructions (Addendum)
I have sent in a prescription for valtrex to use as prescribed for underlying shingles. Please take as prescribed. You should not experience any more lesions while on the medication. If you develop any new lesions or any signs of infections like worsening redness, fever, purulent drainage, or chills, please seek care immediately. If you start to have any visual problems, also seek care immediately. Thank you for letting me participate in your health and well being.  In terms of elevated blood pressure, please start your medication today.  I would like you to check your blood pressure at least a couple times over the next week outside of the office and document these values. It is best if you check the blood pressure at different times in the day. Your goal is <140/90. If your values are consistently above this goal, please return to office for further evaluation. If you start to have chest pain, blurred vision, shortness of breath, severe headache, lower leg swelling, or nausea/vomiting please seek care immediately here or at the ED.    Shingles Shingles is an infection that causes a painful skin rash and fluid-filled blisters. Shingles is caused by the same virus that causes chickenpox. Shingles only develops in people who:  Have had chickenpox.  Have gotten the chickenpox vaccine. (This is rare.)  The first symptoms of shingles may be itching, tingling, or pain in an area on your skin. A rash will follow in a few days or weeks. The rash is usually on one side of the body in a bandlike or beltlike pattern. Over time, the rash turns into fluid-filled blisters that break open, scab over, and dry up. Medicines may:  Help you manage pain.  Help you recover more quickly.  Help to prevent long-term problems.  Follow these instructions at home: Medicines  Take medicines only as told by your doctor.  Apply an anti-itch or numbing cream to the affected area as told by your doctor. Blister and Rash  Care  Take a cool bath or put cool compresses on the area of the rash or blisters as told by your doctor. This may help with pain and itching.  Keep your rash covered with a loose bandage (dressing). Wear loose-fitting clothing.  Keep your rash and blisters clean with mild soap and cool water or as told by your doctor.  Check your rash every day for signs of infection. These include redness, swelling, and pain that lasts or gets worse.  Do not pick your blisters.  Do not scratch your rash. General instructions  Rest as told by your doctor.  Keep all follow-up visits as told by your doctor. This is important.  Until your blisters scab over, your infection can cause chickenpox in people who have never had it or been vaccinated against it. To prevent this from happening, avoid touching other people or being around other people, especially: ? Babies. ? Pregnant women. ? Children who have eczema. ? Elderly people who have transplants. ? People who have chronic illnesses, such as leukemia or AIDS. Contact a doctor if:  Your pain does not get better with medicine.  Your pain does not get better after the rash heals.  Your rash looks infected. Signs of infection include: ? Redness. ? Swelling. ? Pain that lasts or gets worse. Get help right away if:  The rash is on your face or nose.  You have pain in your face, pain around your eye area, or loss of feeling on one side of your  face.  You have ear pain or you have ringing in your ear.  You have loss of taste.  Your condition gets worse. This information is not intended to replace advice given to you by your health care provider. Make sure you discuss any questions you have with your health care provider. Document Released: 05/31/2008 Document Revised: 08/08/2016 Document Reviewed: 09/24/2014 Elsevier Interactive Patient Education  2018 [employer].  IF you received an x-ray today, you will receive an invoice from [facility]  Radiology. Please contact [facility] Radiology at [phone] with questions or concerns regarding your invoice.   IF you received labwork today, you will receive an invoice from [facility]. Please contact [facility] at [phone] with questions or concerns regarding your invoice.   Our billing staff will not be able to assist you with questions regarding bills from these companies.  You will be contacted with the lab results as soon as they are available. The fastest way to get your results is to activate your My Chart account. Instructions are located on the last page of this paperwork. If you have not heard from [place] regarding the results in 2 weeks, please contact this office.

## 2018-05-01 ENCOUNTER — Ambulatory Visit: Payer: Self-pay | Admitting: *Deleted

## 2018-05-01 NOTE — Telephone Encounter (Signed)
Can we call pt to make sure the rash has not worsened and has not spread? If it has, please have her return.

## 2018-05-01 NOTE — Telephone Encounter (Signed)
Phone call to patient. Unable to reach. If patient calls back, please ask if rash has worsened or spread. If yes, patient needs office visit to be seen.

## 2018-05-01 NOTE — Telephone Encounter (Signed)
See below

## 2018-05-01 NOTE — Telephone Encounter (Signed)
I returned her call regarding the Valtrex she was prescribed for the shingles on 04/26/18 by [place].  She read the side effects and "Does not want the medicine to stop or interfere with her urine flow".   She denies symptoms when asked if she is having urinary symptoms.   She also mentioned she stopped taking the Valtrex after 2 days of taking it.     It gave her diarrhea.   Not having diarrhea now.     She denied ever having pain with her shingles outbreak when asked if she was hurting.   See triage notes.   Reason for Disposition . Caller has URGENT medication question about med that PCP prescribed and triager unable to answer question    Was prescribed Valtrex for shingles.   She has stopped taking it it due to diarrhea and "I don't want it to stop my flow of urine".  Answer Assessment - Initial Assessment Questions 1. SYMPTOMS: "Do you have any symptoms?"     I stopped my Valtrex because I don't want to have any urinary tract symptoms.   "I'm not having any symptoms".     "I'm being treated for shingles is reason for the Valtrex.".    It has upset my stomach.   "I feel woozy".    2. SEVERITY: If symptoms are present, ask "Are they mild, moderate or severe?"     The shingles are red now but are not itching.   No scabs.    I have never hurt with these shingles.      But these big pills are more than I can take.   I took them for 2 days.  (Valtrex).    They made my stomach hurt and gave me diarrhea.   No diarrhea now.  Protocols used: MEDICATION QUESTION CALL-A-AH

## 2018-06-26 DEATH — deceased

## 2018-07-30 ENCOUNTER — Other Ambulatory Visit: Payer: Self-pay

## 2018-07-30 ENCOUNTER — Encounter (HOSPITAL_COMMUNITY): Payer: Self-pay | Admitting: Emergency Medicine

## 2018-07-30 ENCOUNTER — Telehealth: Payer: Self-pay | Admitting: Family Medicine

## 2018-07-30 ENCOUNTER — Emergency Department (HOSPITAL_COMMUNITY)
Admission: EM | Admit: 2018-07-30 | Discharge: 2018-07-30 | Disposition: A | Discharge status: Home / Self Care | Payer: Medicare Other | Attending: Emergency Medicine | Admitting: Emergency Medicine

## 2018-07-30 DIAGNOSIS — R197 Diarrhea, unspecified: Secondary | ICD-10-CM | POA: Insufficient documentation

## 2018-07-30 DIAGNOSIS — Z79899 Other long term (current) drug therapy: Secondary | ICD-10-CM | POA: Insufficient documentation

## 2018-07-30 DIAGNOSIS — I10 Essential (primary) hypertension: Secondary | ICD-10-CM | POA: Diagnosis not present

## 2018-07-30 DIAGNOSIS — Z91040 Latex allergy status: Secondary | ICD-10-CM | POA: Diagnosis not present

## 2018-07-30 DIAGNOSIS — Z9049 Acquired absence of other specified parts of digestive tract: Secondary | ICD-10-CM | POA: Diagnosis not present

## 2018-07-30 LAB — CBC
HCT: 36.8 % (ref 36.0–46.0)
Hemoglobin: 11.5 g/dL — ABNORMAL LOW (ref 12.0–15.0)
MCH: 28.2 pg (ref 26.0–34.0)
MCHC: 31.3 g/dL (ref 30.0–36.0)
MCV: 90.2 fL (ref 78.0–100.0)
PLATELETS: 128 10*3/uL — AB (ref 150–400)
RBC: 4.08 MIL/uL (ref 3.87–5.11)
RDW: 16 % — ABNORMAL HIGH (ref 11.5–15.5)
WBC: 2.8 10*3/uL — AB (ref 4.0–10.5)

## 2018-07-30 LAB — URINALYSIS, ROUTINE W REFLEX MICROSCOPIC
BILIRUBIN URINE: NEGATIVE
Glucose, UA: NEGATIVE mg/dL
Ketones, ur: NEGATIVE mg/dL
LEUKOCYTES UA: NEGATIVE
NITRITE: POSITIVE — AB
PH: 6 (ref 5.0–8.0)
Protein, ur: NEGATIVE mg/dL
Specific Gravity, Urine: 1.009 (ref 1.005–1.030)

## 2018-07-30 LAB — COMPREHENSIVE METABOLIC PANEL
ALBUMIN: 3.4 g/dL — AB (ref 3.5–5.0)
ALK PHOS: 62 U/L (ref 38–126)
ALT: 18 U/L (ref 0–44)
ANION GAP: 7 (ref 5–15)
AST: 50 U/L — ABNORMAL HIGH (ref 15–41)
BILIRUBIN TOTAL: 0.8 mg/dL (ref 0.3–1.2)
BUN: 18 mg/dL (ref 8–23)
CALCIUM: 9 mg/dL (ref 8.9–10.3)
CO2: 26 mmol/L (ref 22–32)
Chloride: 105 mmol/L (ref 98–111)
Creatinine, Ser: 0.74 mg/dL (ref 0.44–1.00)
GFR calc Af Amer: >60 mL/min (ref >60)
GFR calc non Af Amer: >60 mL/min (ref >60)
GLUCOSE: 103 mg/dL — AB (ref 70–99)
Potassium: 3.2 mmol/L — ABNORMAL LOW (ref 3.5–5.1)
SODIUM: 138 mmol/L (ref 135–145)
TOTAL PROTEIN: 7.4 g/dL (ref 6.5–8.1)

## 2018-07-30 LAB — LIPASE, BLOOD: Lipase: 32 U/L (ref 11–51)

## 2018-07-30 MED ORDER — POTASSIUM CHLORIDE CRYS ER 20 MEQ PO TBCR
40.0000 meq | EXTENDED_RELEASE_TABLET | Freq: Once | ORAL | Status: AC
  Administered 2018-07-30: 40 meq via ORAL
  Filled 2018-07-30: qty 2

## 2018-07-30 NOTE — Telephone Encounter (Signed)
12:17 PM Call from team health nurse.    Patient called after hours with symptoms of diarrhea.  Few times per day Over the past 1 week.  Initially stool now just clear mucus.  She tried initial treatment with Pepto-Bismol.  She has had decreased p.o. intake, and has endorsed some fatigue/weakness.  Side of the week on the phone per her nurse.  Also not been taking her blood pressure medication.  Nurse recommended evaluation through emergency room today.  Patient plans on calling friend for eval today.  Agree with plan.

## 2018-07-30 NOTE — ED Provider Notes (Signed)
[facility] EMERGENCY DEPARTMENT Provider Note   CSN: [id number] Arrival date & time: 07/30/18  1449     History   Chief Complaint Chief Complaint  Patient presents with  . Diarrhea    HPI Shelly Morse is a 84 y.o. female with a history of bronchiectasis, hypertension, and pyelonephritis who presents due to 1 week of diarrhea.  She says that she has had multiple episodes of loose, diarrhea like stools over the past week.  She says that she has at least 2 episodes of diarrhea daily.  She believes that she is dehydrated.  She denies fever, chills, abdominal pain, nausea, and vomiting.  She has not been on any antimicrobials recently.  She has not been hospitalized recently.  She has no history of C. difficile or other GI infection that she is aware of.  She has taken Pepto-Bismol, but this has provided only minimal relief.  She lives by herself with a caregiver who checks on her.  She does not feel constipated.  She denies dysuria.   HPI  Past Medical History:  Diagnosis Date  . Bronchiectasis ([place])   . Cataract   . Cervical disc disease   . GERD (gastroesophageal reflux disease)   . Hypertension   . MVP (mitral valve prolapse)   . Osteoporosis   . Positive PPD   . Pyelonephritis 06/2011  . Thyroid disease     Patient Active Problem List   Diagnosis Date Noted  . Post-nasal drip 05/05/2016  . Irritable larynx 05/05/2016  . Bronchiectasis without complication ([place]) 38/75/6433  . History of hemoptysis 09/20/2014  . History of positive PPD, untreated 09/20/2014  . GERD (gastroesophageal reflux disease)   . Hypertension     Past Surgical History:  Procedure Laterality Date  . ABDOMINAL HYSTERECTOMY    . cataract surgery     right  . CESAREAN SECTION    . CHOLECYSTECTOMY    . EYE SURGERY    . PARTIAL HYSTERECTOMY     fibroid left ovary  . TONSILLECTOMY AND ADENOIDECTOMY       OB History   None      Home Medications    Prior to  Admission medications   Medication Sig Start Date End Date Taking? Authorizing Provider  Calcium Carbonate-Vitamin D (CALTRATE 600+D PO) Take by mouth 2 (two) times daily.    Yes [provider]  cholecalciferol (VITAMIN D) 1000 UNITS tablet Take 1,000 Units by mouth daily.   Yes [provider]  ibuprofen (ADVIL) 200 MG tablet Take 200 mg by mouth every 6 (six) hours as needed for mild pain.    Yes [provider]  losartan (COZAAR) 50 MG tablet Take 1 tablet (50 mg total) by mouth daily. 07/29/17  Yes Shawnee Knapp, MD  magnesium 30 MG tablet Take 30 mg by mouth 2 (two) times daily.   Yes [provider]  Multiple Vitamin (MULTIVITAMIN) tablet Take 1 tablet by mouth daily.   Yes [provider]    Family History Family History  Problem Relation Age of Onset  . Diabetes Father   . Thyroid disease Sister   . Thyroid disease Brother   . Heart disease Mother   . Cancer Maternal Grandfather     Social History Social History   Tobacco Use  . Smoking status: Never Smoker  . Smokeless tobacco: Never Used  Substance Use Topics  . Alcohol use: No  . Drug use: No     Allergies  Latex   Review of Systems Review of Systems Review of Systems   Constitutional  Negative for fever  Negative for chills  HENT  Negative for ear pain  Negative for sore throat  Negative for difficultly swallowing  Eyes  Negative for eye pain  Negative for visual disturbance  Respiratory  Negative for shortness of breath  Negative for cough  CV  Negative for chest pain  Negative for leg swelling  Abdomen  Negative for abdominal pain  Negative for nausea  Negative for vomiting  +diarrhea  MSK  Negative for extremity pain  Negative for back pain  Skin  Negative for rash  Negative for wound  Neuro  Negative for syncope  Negative for difficultly speaking  Psych  Negative for confusion   The remainder of the ROS was reviewed and  negative except as documented above.      Physical Exam Updated Vital Signs BP (!) 148/51   Pulse (!) 59   Temp 98.3 F (36.8 C) (Oral)   Resp 17   SpO2 97%   Physical Exam Physical Exam Constitutional  Nursing notes reviewed  Vital signs reviewed  HEENT  No obvious trauma  Supple without meningismus, mass, or overt JVD  EOMI  No scleral icterus or injection  Dry mucous membranes  Respiratory  Effort normal  CTAB  No respiratory distress  CV  Normal rate  No obvious murmurs  No pitting edema  Abdomen  Soft  Non-tender  Non-distended  No peritonitis  MSK  Atraumatic  No obvious deformity  ROM appropriate  Skin  Warm  Dry  Neuro  Awake and alert  EOMI  Moving all extremities  Psychiatric  Mood and affect normal        ED Treatments / Results  Labs (all labs ordered are listed, but only abnormal results are displayed) Labs Reviewed  COMPREHENSIVE METABOLIC PANEL - Abnormal; Notable for the following components:      Result Value   Potassium 3.2 (*)    Glucose, Bld 103 (*)    Albumin 3.4 (*)    AST 50 (*)    All other components within normal limits  CBC - Abnormal; Notable for the following components:   WBC 2.8 (*)    Hemoglobin 11.5 (*)    RDW 16.0 (*)    Platelets 128 (*)    All other components within normal limits  URINALYSIS, ROUTINE W REFLEX MICROSCOPIC - Abnormal; Notable for the following components:   APPearance HAZY (*)    Hgb urine dipstick SMALL (*)    Nitrite POSITIVE (*)    Bacteria, UA MANY (*)    All other components within normal limits  GASTROINTESTINAL PANEL BY PCR, STOOL (REPLACES STOOL CULTURE)  LIPASE, BLOOD    EKG None  Radiology No results found.  Procedures Procedures (including critical care time)  Medications Ordered in ED Medications  potassium chloride SA (K-DUR,KLOR-CON) CR tablet 40 mEq (40 mEq Oral Given 07/30/18 1931)     Initial Impression / Assessment and Plan / ED  Course  I have reviewed the triage vital signs and the nursing notes.  Pertinent labs & imaging results that were available during my care of the patient were reviewed by me and considered in my medical decision making (see chart for details).     Theron Arista presents with 1 week of diarrhea as per above.  She denies having any abdominal pain or blood in her stools.  She is tolerating p.o.  She denies vomiting.  Although she has no risk factors for C. difficile or other GI pathogen, we will plan to obtain a stool study.  We will also obtain screening labs, especially to evaluate for dehydration or kidney injury.  She remains well-appearing on multiple reassessments.  She has no abdominal tenderness.  She does not have a leukocytosis.  She is mildly hypokalemic with a potassium of 3.2.  No other significant abnormalities on CMP.  Lipase not elevated.  Despite being in the ED for several hours, she did not feel like she could have a bowel movement.  She was unable to provide a stool sample.  Her urine sample provides a mixed picture.  It is nitrite positive but there is also small amounts of blood in her urine.  I suspect this is causing her nitrites to be positive.  She has many bacteria but no WBCs.  She denies dysuria.  I do not think that she has a UTI.  No fever, no leukocytosis.  I suspect this is a symptomatic bacteriuria.  She was given oral potassium repletion prior to discharge.  She said that she would like to be discharged and that she would not like to be in the ED any longer for further evaluation.  I have very low suspicion for acute GI pathogens or C. difficile since she was unable to have a bowel movement in the ED.  No signs of AKI.  Her caregiver also felt comfortable with this.  She was discharged home in stable condition.  I provided ED return precautions.  Final Clinical Impressions(s) / ED Diagnoses   Final diagnoses:  Diarrhea, unspecified type    ED Discharge Orders     None       Alford Highland, MD 07/30/18 2349    Dorie Rank, MD 08/01/18 1729

## 2018-07-30 NOTE — Discharge Instructions (Signed)
Shelly Morse:  Thank you for allowing [place] to take care of you today.  We hope you begin feeling better soon.  Your lab tests are reassuring.  To-Do: Please follow-up with your primary doctor Please return to the Emergency Department or call 911 if you experience chest pain, shortness of breath, severe pain, severe fever, altered mental status, or have any reason to think that you need emergency medical care.  Thank you again.  Hope you feel better soon.

## 2018-07-30 NOTE — ED Notes (Signed)
Patient asked for urine sample, and stool sample.She states that she has a system, and can not give samples on command.

## 2018-07-30 NOTE — ED Triage Notes (Signed)
Pt presents to Ed for assessment of 1 week of watery stools and mucous.  C/o abdominal cramping the first day, but none since.  Pt concerned for Hepatitis.  States "mucous is normal because I swallow a lot from my bronchitis".

## 2018-08-03 ENCOUNTER — Encounter: Payer: Medicare Other | Admitting: Family Medicine

## 2018-08-17 ENCOUNTER — Other Ambulatory Visit: Payer: Self-pay

## 2018-08-17 ENCOUNTER — Ambulatory Visit (INDEPENDENT_AMBULATORY_CARE_PROVIDER_SITE_OTHER): Payer: Medicare Other

## 2018-08-17 ENCOUNTER — Ambulatory Visit (INDEPENDENT_AMBULATORY_CARE_PROVIDER_SITE_OTHER): Payer: Medicare Other | Admitting: Family Medicine

## 2018-08-17 ENCOUNTER — Encounter: Payer: Self-pay | Admitting: Family Medicine

## 2018-08-17 VITALS — BP 151/75 | HR 72 | Temp 98.6°F | Resp 16 | Ht 62.99 in | Wt 106.0 lb

## 2018-08-17 DIAGNOSIS — R590 Localized enlarged lymph nodes: Secondary | ICD-10-CM | POA: Diagnosis not present

## 2018-08-17 DIAGNOSIS — R634 Abnormal weight loss: Secondary | ICD-10-CM

## 2018-08-17 DIAGNOSIS — D61818 Other pancytopenia: Secondary | ICD-10-CM | POA: Diagnosis not present

## 2018-08-17 DIAGNOSIS — L049 Acute lymphadenitis, unspecified: Secondary | ICD-10-CM

## 2018-08-17 DIAGNOSIS — J479 Bronchiectasis, uncomplicated: Secondary | ICD-10-CM

## 2018-08-17 DIAGNOSIS — R7611 Nonspecific reaction to tuberculin skin test without active tuberculosis: Secondary | ICD-10-CM

## 2018-08-17 DIAGNOSIS — R918 Other nonspecific abnormal finding of lung field: Secondary | ICD-10-CM

## 2018-08-17 DIAGNOSIS — R221 Localized swelling, mass and lump, neck: Secondary | ICD-10-CM

## 2018-08-17 NOTE — Progress Notes (Signed)
Subjective:    Patient ID: Shelly Morse, female    DOB: 02-29-1922, 84 y.o.   MRN: [id number] Chief Complaint  Patient presents with  . lymph nodes x 2 noticed on 05/30/18.    lymph nodes were swollen but have gone done since it was while before she could get an appt.  No pain per pt .  When she went to [facility] hospital no one noticed the swollen lymphs while she was there    HPI  She was in the ER about 2.5 wks ago with the possibility of a UTI.  Past Medical History:  Diagnosis Date  . Bronchiectasis ([place])   . Cataract   . Cervical disc disease   . GERD (gastroesophageal reflux disease)   . Hypertension   . MVP (mitral valve prolapse)   . Osteoporosis   . Positive PPD   . Pyelonephritis 06/2011  . Thyroid disease    Past Surgical History:  Procedure Laterality Date  . ABDOMINAL HYSTERECTOMY    . cataract surgery     right  . CESAREAN SECTION    . CHOLECYSTECTOMY    . EYE SURGERY    . PARTIAL HYSTERECTOMY     fibroid left ovary  . TONSILLECTOMY AND ADENOIDECTOMY     Current Outpatient Medications on File Prior to Visit  Medication Sig Dispense Refill  . Calcium Carbonate-Vitamin D (CALTRATE 600+D PO) Take by mouth 2 (two) times daily.     . cholecalciferol (VITAMIN D) 1000 UNITS tablet Take 1,000 Units by mouth daily.    Marland Kitchen ibuprofen (ADVIL) 200 MG tablet Take 200 mg by mouth every 6 (six) hours as needed for mild pain.     Marland Kitchen losartan (COZAAR) 50 MG tablet Take 1 tablet (50 mg total) by mouth daily. 90 tablet 3  . magnesium 30 MG tablet Take 30 mg by mouth 2 (two) times daily.    . Multiple Vitamin (MULTIVITAMIN) tablet Take 1 tablet by mouth daily.     No current facility-administered medications on file prior to visit.    Allergies  Allergen Reactions  . Latex Itching   Family History  Problem Relation Age of Onset  . Diabetes Father   . Thyroid disease Sister   . Thyroid disease Brother   . Heart disease Mother   . Cancer Maternal Grandfather    Social  History   Socioeconomic History  . Marital status: Widowed    Spouse name: Not on file  . Number of children: 2  . Years of education: Not on file  . Highest education level: Not on file  Occupational History  . Not on file  Social Needs  . Financial resource strain: Not on file  . Food insecurity:    Worry: Not on file    Inability: Not on file  . Transportation needs:    Medical: Not on file    Non-medical: Not on file  Tobacco Use  . Smoking status: Never Smoker  . Smokeless tobacco: Never Used  Substance and Sexual Activity  . Alcohol use: No  . Drug use: No  . Sexual activity: Never  Lifestyle  . Physical activity:    Days per week: Not on file    Minutes per session: Not on file  . Stress: Not on file  Relationships  . Social connections:    Talks on phone: Not on file    Gets together: Not on file    Attends religious service: Not on file  Active member of club or organization: Not on file    Attends meetings of clubs or organizations: Not on file    Relationship status: Not on file  Other Topics Concern  . Not on file  Social History Narrative  . Not on file   Depression screen [facility] 2/9 08/17/2018 04/26/2018 07/28/2017 07/18/2017 11/15/2016  Decreased Interest 0 0 0 0 0  Down, Depressed, Hopeless 0 0 0 0 0  PHQ - 2 Score 0 0 0 0 0     Review of Systems  Constitutional: Positive for fatigue and unexpected weight change. Negative for chills and fever.  Cardiovascular: Negative for leg swelling.  Gastrointestinal: Negative for abdominal pain, diarrhea and vomiting.  Genitourinary: Negative for dysuria, frequency and urgency.  Allergic/Immunologic: Negative for immunocompromised state.  Hematological: Positive for adenopathy.  Psychiatric/Behavioral: Positive for behavioral problems and dysphoric mood.       Objective:   Physical Exam  Constitutional: She appears well-developed and well-nourished. No distress.  HENT:  Head: Normocephalic and atraumatic.   Right Ear: Tympanic membrane, external ear and ear canal normal.  Left Ear: Tympanic membrane, external ear and ear canal normal.  Nose: Nose normal. No mucosal edema or rhinorrhea.  Mouth/Throat: Uvula is midline, oropharynx is clear and moist and mucous membranes are normal. No oropharyngeal exudate.  Eyes: Conjunctivae are normal. Right eye exhibits no discharge. Left eye exhibits no discharge. No scleral icterus.  Neck: Normal range of motion. Neck supple. No thyromegaly present.  Cardiovascular: Normal rate, regular rhythm, normal heart sounds and intact distal pulses.  Pulmonary/Chest: Effort normal and breath sounds normal.  Lymphadenopathy:       Head (right side): Submandibular, preauricular, posterior auricular and occipital adenopathy present.       Head (left side): Submandibular, preauricular, posterior auricular and occipital adenopathy present.    She has cervical adenopathy.       Right cervical: Superficial cervical, deep cervical and posterior cervical adenopathy present.       Left cervical: Superficial cervical, deep cervical and posterior cervical adenopathy present.       Right: No supraclavicular adenopathy present.       Left: No supraclavicular adenopathy present.  Shotty diffuse cervical adenopathy - small ill-defined but definitely increased to concerning degree throughout cervical area. Most promeninent nodes of pt presenting complaint very firm 1cm non-mobile at most superior aspect of bilateral posterior cervical chain. Pt refused to undress her long sleeve jean shirt and pants to allow examination for other areas of lymphadenopathy despite my request and explanation for need.  Neurological: She is alert.  Very slow shuffling gait, dependent upon cane  Skin: Skin is warm and dry. She is not diaphoretic. No erythema.  Psychiatric: Her speech is normal and behavior is normal. Her affect is angry.         BP (!) 151/75 (BP Location: Left Arm, Patient Position:  Sitting, Cuff Size: Normal)   Pulse 72   Temp 98.6 F (37 C) (Oral)   Resp 16   Ht 5' 2.99" (1.6 m)   Wt 106 lb (48.1 kg)   SpO2 94%   BMI 18.78 kg/m   Dg Chest 2 View  Result Date: 08/17/2018 CLINICAL DATA:  One month of progressive cervical lymphadenopathy. History of bronchiectasis and positive PPD. EXAM: CHEST - 2 VIEW COMPARISON:  05/05/2016 FINDINGS: The cardiomediastinal silhouette is grossly unchanged allowing for mild leftward patient rotation today. A small calcified granuloma is again seen in the right upper lobe. There is  chronic interstitial coarsening with mild central bronchiectasis demonstrated to better extent on prior CT examinations. There is mild, asymmetrically increased hazy density throughout the right lung which could in part be due to asymmetric overlying soft tissues related to patient rotation, however there is also the suggestion of mildly increased nodular interstitial density in the right lung compared to the prior study as well. No pleural effusion or pneumothorax is identified. Right upper quadrant abdominal surgical clips are noted. No acute osseous abnormality is seen. IMPRESSION: Mildly increased density and asymmetric interstitial prominence throughout the right lung. While this may be in part due to patient rotation, worsening of chronic interstitial lung disease or superimposed acute atypical/viral infection is a concern. Electronically Signed   By: Logan Bores M.D.   On: 08/17/2018 16:55   Results for orders placed or performed in visit on 08/17/18  CBC with Differential/Platelet  Result Value Ref Range   WBC 3.9 3.4 - 10.8 x10E3/uL   RBC 3.87 3.77 - 5.28 x10E6/uL   Hemoglobin 11.0 (L) 11.1 - 15.9 g/dL   Hematocrit 34.5 34.0 - 46.6 %   MCV 89 79 - 97 fL   MCH 28.4 26.6 - 33.0 pg   MCHC 31.9 31.5 - 35.7 g/dL   RDW 17.7 (H) 12.3 - 15.4 %   Platelets 114 (L) 150 - 450 x10E3/uL   Neutrophils 4 Not Estab. %   Lymphs 66 Not Estab. %   Monocytes 30 Not  Estab. %   Eos 0 Not Estab. %   Basos 0 Not Estab. %   Neutrophils Absolute 0.2 (LL) 1.4 - 7.0 x10E3/uL   Lymphocytes Absolute 2.6 0.7 - 3.1 x10E3/uL   Monocytes Absolute 1.2 (H) 0.1 - 0.9 x10E3/uL   EOS (ABSOLUTE) 0.0 0.0 - 0.4 x10E3/uL   Basophils Absolute 0.0 0.0 - 0.2 x10E3/uL   Immature Granulocytes 0 Not Estab. %   Immature Grans (Abs) 0.0 0.0 - 0.1 x10E3/uL   Hematology Comments: Note:   Sedimentation Rate  Result Value Ref Range   Sed Rate 35 0 - 40 mm/hr  C-reactive protein  Result Value Ref Range   CRP 3 0 - 10 mg/L  Basic metabolic panel  Result Value Ref Range   Glucose 98 65 - 99 mg/dL   BUN 16 10 - 36 mg/dL   Creatinine, Ser 0.90 0.57 - 1.00 mg/dL   GFR calc non Af Amer 55 (L) >59 mL/min/1.73   GFR calc Af Amer 64 >59 mL/min/1.73   BUN/Creatinine Ratio 18 12 - 28   Sodium 141 134 - 144 mmol/L   Potassium 4.2 3.5 - 5.2 mmol/L   Chloride 102 96 - 106 mmol/L   CO2 22 20 - 29 mmol/L   Calcium 8.8 8.7 - 10.3 mg/dL    Assessment & Plan:   1. Lymphadenopathy, cervical - Cervical lymph nodes new x 1 mo - pt refused to undress so that I could examine her for lymphadenopathy in other places - check neck CT   2. History of positive PPD, untreated   3. Bronchiectasis without complication ([place]) - refer back to pulm for further eval to see if that could be connected to lymphadenoapthy  4. Acute lymphadenitis   5. Localized swelling, mass or lump of neck   6. Pancytopenia ([place]) - newly developed since last yr, slightly worse today than 2 wks prior in ER - refer to hematology for further eval  7. Abnormal findings on diagnostic imaging of lung - check chest CT  8.  Unintentional weight loss of more than 10% body weight within 6 months Pt has lost 22 lbs in past yr per Epic     Orders Placed This Encounter  Procedures  . DG Chest 2 View    Standing Status:   Future    Number of Occurrences:   1    Standing Expiration Date:   08/17/2019    Order Specific Question:    Reason for Exam (SYMPTOM  OR DIAGNOSIS REQUIRED)    Answer:   month of progressive firm shoddy anterior and posterior cervical lymphadenopathy    Order Specific Question:   Preferred imaging location?    Answer:   External  . CT SOFT TISSUE NECK W CONTRAST    Standing Status:   Future    Standing Expiration Date:   11/18/2019    Order Specific Question:   If indicated for the ordered procedure, I authorize the administration of contrast media per Radiology protocol    Answer:   Yes    Order Specific Question:   Preferred imaging location?    Answer:   [facility]    Order Specific Question:   Radiology Contrast Protocol - do NOT remove file path    Answer:   \\[email or web address]\CTProtocols.pdf  . CT CHEST WO CONTRAST    Standing Status:   Future    Standing Expiration Date:   10/19/2019    Order Specific Question:   ** REASON FOR EXAM (FREE TEXT)    Answer:   abnormal cxr needs further eval    Order Specific Question:   Preferred imaging location?    Answer:   [facility]    Order Specific Question:   Radiology Contrast Protocol - do NOT remove file path    Answer:   \\[email or web address]\Radiant\CTProtocols.pdf  . CBC with Differential/Platelet  . Sedimentation Rate  . C-reactive protein  . QuantiFERON-TB Gold Plus  . Basic metabolic panel    Order Specific Question:   Has the patient fasted?    Answer:   No  . Ambulatory referral to Pulmonology    Referral Priority:   Routine    Referral Type:   Consultation    Referral Reason:   Specialty Services Required    Requested Specialty:   Pulmonary Disease    Number of Visits Requested:   1  . Ambulatory referral to Hematology    Referral Priority:   Routine    Referral Type:   Consultation    Referral Reason:   Specialty Services Required    Requested Specialty:   Oncology    Number of Visits Requested:   1     Delman Cheadle, M.D.  Primary Care at [facility] [address] [place], [place]  [phone] phone [phone] fax  08/18/18 11:38 AM

## 2018-08-17 NOTE — Patient Instructions (Signed)
Since you have a history of bronchiectasis, we need to check your lungs today with a chest xray to ensure we are not seeing enlarged lymph nodes in your chest as well.  Then we need to characterize the lymphnodes better and see if there is a concern if these could be from cancer with a neck CT.  I would also like you to pay a visit back to Dr. Chase Caller at [facility] for further evaluation - you will receive calls to schedule you appointment for the neck CT (from [place]) and the pulmonary consult (from [facility]).  Lymphadenopathy Lymphadenopathy refers to swollen or enlarged lymph glands, also called lymph nodes. Lymph glands are part of your body's defense (immune) system, which protects the body from infections, germs, and diseases. Lymph glands are found in many locations in your body, including the neck, underarm, and groin. Many things can cause lymph glands to become enlarged. When your immune system responds to germs, such as viruses or bacteria, infection-fighting cells and fluid build up. This causes the glands to grow in size. Usually, this is not something to worry about. The swelling and any soreness often go away without treatment. However, swollen lymph glands can also be caused by a number of diseases. Your health care provider may do various tests to help determine the cause. If the cause of your swollen lymph glands cannot be found, it is important to monitor your condition to make sure the swelling goes away. Follow these instructions at home: Watch your condition for any changes. The following actions may help to lessen any discomfort you are feeling:  Get plenty of rest.  Take medicines only as directed by your health care provider. Your health care provider may recommend over-the-counter medicines for pain.  Apply moist heat compresses to the site of swollen lymph nodes as directed by your health care provider. This can help reduce any pain.  Check your lymph  nodes daily for any changes.  Keep all follow-up visits as directed by your health care provider. This is important.  Contact a health care provider if:  Your lymph nodes are still swollen after 2 weeks.  Your swelling increases or spreads to other areas.  Your lymph nodes are hard, seem fixed to the skin, or are growing rapidly.  Your skin over the lymph nodes is red and inflamed.  You have a fever.  You have chills.  You have fatigue.  You develop a sore throat.  You have abdominal pain.  You have weight loss.  You have night sweats. Get help right away if:  You notice fluid leaking from the area of the enlarged lymph node.  You have severe pain in any area of your body.  You have chest pain.  You have shortness of breath. This information is not intended to replace advice given to you by your health care provider. Make sure you discuss any questions you have with your health care provider. Document Released: 09/21/2008 Document Revised: 05/20/2016 Document Reviewed: 07/18/2014 Elsevier Interactive Patient Education  [employer].

## 2018-08-18 DIAGNOSIS — D61818 Other pancytopenia: Secondary | ICD-10-CM | POA: Insufficient documentation

## 2018-08-18 DIAGNOSIS — R590 Localized enlarged lymph nodes: Secondary | ICD-10-CM | POA: Insufficient documentation

## 2018-08-18 LAB — BASIC METABOLIC PANEL
BUN/Creatinine Ratio: 18 (ref 12–28)
BUN: 16 mg/dL (ref 10–36)
CALCIUM: 8.8 mg/dL (ref 8.7–10.3)
CO2: 22 mmol/L (ref 20–29)
Chloride: 102 mmol/L (ref 96–106)
Creatinine, Ser: 0.9 mg/dL (ref 0.57–1.00)
GFR calc Af Amer: 64 mL/min/{1.73_m2} (ref >59)
GFR calc non Af Amer: 55 mL/min/{1.73_m2} — ABNORMAL LOW (ref >59)
Glucose: 98 mg/dL (ref 65–99)
POTASSIUM: 4.2 mmol/L (ref 3.5–5.2)
Sodium: 141 mmol/L (ref 134–144)

## 2018-08-18 LAB — SEDIMENTATION RATE: SED RATE: 35 mm/h (ref 0–40)

## 2018-08-18 LAB — CBC WITH DIFFERENTIAL/PLATELET
BASOS ABS: 0 10*3/uL (ref 0.0–0.2)
Basos: 0 %
EOS (ABSOLUTE): 0 10*3/uL (ref 0.0–0.4)
EOS: 0 %
Hematocrit: 34.5 % (ref 34.0–46.6)
Hemoglobin: 11 g/dL — ABNORMAL LOW (ref 11.1–15.9)
IMMATURE GRANS (ABS): 0 10*3/uL (ref 0.0–0.1)
IMMATURE GRANULOCYTES: 0 %
LYMPHS: 66 %
Lymphocytes Absolute: 2.6 10*3/uL (ref 0.7–3.1)
MCH: 28.4 pg (ref 26.6–33.0)
MCHC: 31.9 g/dL (ref 31.5–35.7)
MCV: 89 fL (ref 79–97)
MONOCYTES: 30 %
Monocytes Absolute: 1.2 10*3/uL — ABNORMAL HIGH (ref 0.1–0.9)
NEUTROS PCT: 4 %
Neutrophils Absolute: 0.2 10*3/uL — CL (ref 1.4–7.0)
PLATELETS: 114 10*3/uL — AB (ref 150–450)
RBC: 3.87 x10E6/uL (ref 3.77–5.28)
RDW: 17.7 % — ABNORMAL HIGH (ref 12.3–15.4)
WBC: 3.9 10*3/uL (ref 3.4–10.8)

## 2018-08-18 LAB — C-REACTIVE PROTEIN: CRP: 3 mg/L (ref 0–10)

## 2018-08-22 ENCOUNTER — Telehealth: Payer: Self-pay | Admitting: Family Medicine

## 2018-08-22 NOTE — Telephone Encounter (Signed)
Patient calling again, requesting call back asap

## 2018-08-22 NOTE — Telephone Encounter (Signed)
Copied from [place]. Topic: General - Other >> Aug 22, 2018 10:00 AM Synthia Innocent wrote: Reason for CRM: Requesting xray results, states someone called her

## 2018-08-22 NOTE — Telephone Encounter (Signed)
Called and LVM for pt to give [place] a call back at the office about results.

## 2018-08-23 LAB — QUANTIFERON-TB GOLD PLUS
QuantiFERON Nil Value: 1.39 IU/mL
QuantiFERON TB1 Ag Value: 0.7 IU/mL
QuantiFERON TB2 Ag Value: 0.54 IU/mL
QuantiFERON-TB Gold Plus: NEGATIVE

## 2018-08-25 ENCOUNTER — Telehealth: Payer: Self-pay | Admitting: Hematology and Oncology

## 2018-08-25 NOTE — Telephone Encounter (Signed)
Spoke to the pt's niece, Jenny Reichmann, to schedule a hematology appt. Pt has been scheduled to see Dr. Lindi Adie on 9/12 at 1pm. Ms. Rhorer is aware her aunt should arrive 1 minutes early. Voiced understanding.

## 2018-08-26 ENCOUNTER — Encounter: Payer: Medicare Other | Admitting: Family Medicine

## 2018-08-28 ENCOUNTER — Telehealth: Payer: Self-pay | Admitting: Family Medicine

## 2018-08-28 NOTE — Telephone Encounter (Signed)
Received call 8/29 ~11am from [place] (I think niece - did not get last name as I thought this was picking up a call from staff so was surprised and did not have a chance to clarify and was running  behind in the middle of pt care day w/ pt's waiting in office.) Jenny Reichmann stated that she would be Ms. Deaton's HCPOA and she [place] and a female (of no relation - not sure if this means to [place] or Ms. Zendejas) have been recently noticing that Ms. Myhre has been more forgetful and not herself, not trustworthy - not scheduling/keeping appointments. Jenny Reichmann talked with her lawyer who stated that to get the HCPOA enacted so that Jenny Reichmann could make medical decisions on behalf of Ms. Heggs they would need a letter from me confirming this.  My first avail appt was 9/23 so they are sched then but Jenny Reichmann thinks that is to far out consider the recent decline and significance of her health issues.  I offered to work her in and [place] stated that they don't want that - she just wants me to write the letter. Explained that I saw Ms. Danne Baxter by herself in her CPE 1 yrs ago at which time there were no concerns - in fact she was living on her own - and the only other time I have met, spoke, or examined her was at our last OV 1 week prior which she attended alone, provided the reason she was there, consented to my recommended eval with labs, imaging, referral (though did refuse to undress for an exam but she was quite annoyed by the wait time in the office.)  Therefore, for me to feel comfortable documenting in a letter to her lawyer that she no longer has the capacity to make her own medical decision is most certainly going to require an office visit for eval - it is a complex issue to gage the depth and intracies of pt's understanding of her medical issues, or ability to understand potential diagnosis, express a choice, appreciate how it applies to her, and explain her reasoning for that choice (compare choices and their inferred  consequences. Should likely get a baseline memory testing with MMSE and MoCA at that visit as well. Jenny Reichmann agrees.  At last CPE pt told me that HCPOA is her niece Sherial Ebrahim Rohorar. She has 2 sons but her younger son is in prison and won't be out for a while. Older son doesn't manage his $$ well. Burnett Harry 3d/wk - checks on her and caretakers  PLEASE BOOK PT INTO SAME DAY APPT SLOT BUT NEEDS SLOT RIGHT BEFORE LUNCH OR BEFORE END OF DAY AS THIS WILL DEFINITELY BE A 40 -60 MINUTE APPOINTMENT - IDEAL WOULD BE TO BOOK HER INTO BOTH SAME DAY APPT SLOTS BACK TO BACK so we don't have to stay drastically later than nml or run into afternoon. Needs to be seen ASAP cons she is facing life-threatening medical problems and her family is thinking she is not competent to make her own medical decision at this time.   PLEASE LET BOTH PATIENT AND CINDY ROHORAR KNOW THAT THEY BOTH NEED TO BE PRESENT AT THAT APPOINTMENT.  I will initially can interview pt alone and then bring Cindy during the memory testing and to talk to them together about my perception of pt's competency at that time. If Jenny Reichmann thinks it would be hepful for anyone else to come along to provide additional hx to me such as pt's sons or  her caretaker Mr. Sharlett Iles than that is fine - the more information the better.  Please encourage Jenny Reichmann to document a list of the changes she has noticed and concerning behaviors - esp any examples that demonstrate Ms. Greff's lack of understanding the implications of her actions

## 2018-08-28 NOTE — Telephone Encounter (Signed)
[place] spoke to pt - it is documented on xray

## 2018-08-30 ENCOUNTER — Telehealth: Payer: Self-pay | Admitting: Family Medicine

## 2018-08-30 NOTE — Telephone Encounter (Signed)
Copied from [place]. Topic: Quick Communication - See Telephone Encounter >> Aug 30, 2018  1:29 PM Margot Ables wrote: CRM for notification. See Telephone encounter for: 08/30/18.  Please change order for CT chest to include contrast. Pt is scheduled for 09/04/18.

## 2018-09-04 ENCOUNTER — Telehealth: Payer: Self-pay | Admitting: Family Medicine

## 2018-09-04 ENCOUNTER — Ambulatory Visit
Admission: RE | Admit: 2018-09-04 | Discharge: 2018-09-04 | Disposition: A | Discharge status: Home / Self Care | Payer: Medicare Other | Source: Ambulatory Visit | Attending: Family Medicine | Admitting: Family Medicine

## 2018-09-04 DIAGNOSIS — L049 Acute lymphadenitis, unspecified: Secondary | ICD-10-CM

## 2018-09-04 DIAGNOSIS — R634 Abnormal weight loss: Secondary | ICD-10-CM

## 2018-09-04 DIAGNOSIS — R221 Localized swelling, mass and lump, neck: Secondary | ICD-10-CM

## 2018-09-04 MED ORDER — IOPAMIDOL (ISOVUE-300) INJECTION 61%
75.0000 mL | Freq: Once | INTRAVENOUS | Status: AC | PRN
  Administered 2018-09-04: 75 mL via INTRAVENOUS

## 2018-09-04 NOTE — Telephone Encounter (Signed)
Spoke with patients niece, Sena Slate about wanting to see a Dr to get an incompetency letter for Ms. Danne Baxter. The niece states that she doesn't want to move forward with this for now and will call when she thinks it is getting worse to make that appt.

## 2018-09-07 ENCOUNTER — Telehealth: Payer: Self-pay | Admitting: Hematology and Oncology

## 2018-09-07 ENCOUNTER — Other Ambulatory Visit: Payer: Self-pay

## 2018-09-07 ENCOUNTER — Inpatient Hospital Stay: Payer: Medicare Other

## 2018-09-07 ENCOUNTER — Inpatient Hospital Stay: Payer: Medicare Other | Attending: Hematology and Oncology | Admitting: Hematology and Oncology

## 2018-09-07 ENCOUNTER — Other Ambulatory Visit: Payer: Self-pay | Admitting: *Deleted

## 2018-09-07 DIAGNOSIS — I10 Essential (primary) hypertension: Secondary | ICD-10-CM

## 2018-09-07 DIAGNOSIS — E079 Disorder of thyroid, unspecified: Secondary | ICD-10-CM | POA: Insufficient documentation

## 2018-09-07 DIAGNOSIS — I341 Nonrheumatic mitral (valve) prolapse: Secondary | ICD-10-CM | POA: Insufficient documentation

## 2018-09-07 DIAGNOSIS — M5030 Other cervical disc degeneration, unspecified cervical region: Secondary | ICD-10-CM | POA: Insufficient documentation

## 2018-09-07 DIAGNOSIS — D61818 Other pancytopenia: Secondary | ICD-10-CM

## 2018-09-07 DIAGNOSIS — K219 Gastro-esophageal reflux disease without esophagitis: Secondary | ICD-10-CM

## 2018-09-07 DIAGNOSIS — D709 Neutropenia, unspecified: Secondary | ICD-10-CM | POA: Diagnosis not present

## 2018-09-07 DIAGNOSIS — Z79899 Other long term (current) drug therapy: Secondary | ICD-10-CM | POA: Insufficient documentation

## 2018-09-07 DIAGNOSIS — Z993 Dependence on wheelchair: Secondary | ICD-10-CM | POA: Insufficient documentation

## 2018-09-07 DIAGNOSIS — C9200 Acute myeloblastic leukemia, not having achieved remission: Secondary | ICD-10-CM | POA: Diagnosis not present

## 2018-09-07 DIAGNOSIS — M810 Age-related osteoporosis without current pathological fracture: Secondary | ICD-10-CM | POA: Insufficient documentation

## 2018-09-07 LAB — CBC WITH DIFFERENTIAL (CANCER CENTER ONLY)
BAND NEUTROPHILS: 0 %
BASOS PCT: 0 %
Basophils Absolute: 0 10*3/uL (ref 0.0–0.1)
Blasts: 0 %
EOS ABS: 0 10*3/uL (ref 0.0–0.5)
EOS PCT: 0 %
HEMATOCRIT: 34.6 % — AB (ref 34.8–46.6)
Hemoglobin: 11.2 g/dL — ABNORMAL LOW (ref 11.6–15.9)
LYMPHS ABS: 3.5 10*3/uL — AB (ref 0.9–3.3)
Lymphocytes Relative: 61 %
MCH: 29.5 pg (ref 25.1–34.0)
MCHC: 32.4 g/dL (ref 31.5–36.0)
MCV: 91.1 fL (ref 79.5–101.0)
MONO ABS: 0.3 10*3/uL (ref 0.1–0.9)
Metamyelocytes Relative: 1 %
Monocytes Relative: 5 %
Myelocytes: 0 %
NEUTROS ABS: 0.7 10*3/uL — AB (ref 1.5–6.5)
NEUTROS PCT: 11 %
NRBC: 0 /100{WBCs}
OTHER: 22 %
PLATELETS: 92 10*3/uL — AB (ref 145–400)
PROMYELOCYTES RELATIVE: 0 %
RBC: 3.8 MIL/uL (ref 3.70–5.45)
RDW: 19 % — AB (ref 11.2–14.5)
WBC Count: 5.7 10*3/uL (ref 3.9–10.3)

## 2018-09-07 LAB — VITAMIN B12: VITAMIN B 12: 802 pg/mL (ref 180–914)

## 2018-09-07 LAB — PATHOLOGIST SMEAR REVIEW

## 2018-09-07 LAB — LACTATE DEHYDROGENASE: LDH: 226 U/L — ABNORMAL HIGH (ref 98–192)

## 2018-09-07 NOTE — Assessment & Plan Note (Signed)
Severe neutropenia with mild thrombocytopenia: ANC 200 and platelet count 114, hemoglobin 11 CT neck revealed small subcutaneous lymph nodes in the neck but extensive infiltrate right upper lobe, bilateral axillary and calcified mediastinal nodes suspicious for either carcinoid or lymphoma  Counseling: I discussed with the patient and her family that the standard process for testing would require a bone marrow biopsy.  It may also require a lymph node excision.  However given her advanced age, we discussed the risks and benefits of doing any such procedure which would be primarily for prognostic information.  She is unlikely to tolerate any significant systemic therapy for either lymphoma or metastatic disease are leukemia.

## 2018-09-07 NOTE — Progress Notes (Signed)
[place] NOTE  Patient Care Team: Shawnee Knapp, MD as PCP - General (Family Medicine) Lorretta Harp, MD (Cardiology) Danella Sensing, MD (Dermatology) Dian Queen, MD (Obstetrics and Gynecology) Calvert Cantor, MD (Ophthalmology)  CHIEF COMPLAINTS/PURPOSE OF CONSULTATION:  Neutropenia and lymphadenopathy  HISTORY OF PRESENTING ILLNESS:  Shelly Morse 84 y.o. female is here because of recent diagnosis of severe neutropenia with an [place] of 200 along with prominent cervical lymphadenopathy.  Patient underwent CT of the neck which showed diffuse lymphadenopathy in the neck as well as mediastinum and axilla raising the concern for lymphoma versus sarcoidosis versus metastatic cancer.  She was also noted to have severe neutropenia with an [place] of 200.  She has not had any recent infections although she lately she has been having some respiratory issues with chronic cough.  She had a TB test which was negative. She was sent to [place] for evaluation of both the neutropenia and the lymphadenopathy.  I reviewed her records extensively and collaborated the history with the patient.  MEDICAL HISTORY:  Past Medical History:  Diagnosis Date  . Bronchiectasis ([place])   . Cataract   . Cervical disc disease   . GERD (gastroesophageal reflux disease)   . Hypertension   . MVP (mitral valve prolapse)   . Osteoporosis   . Positive PPD   . Pyelonephritis 06/2011  . Thyroid disease     SURGICAL HISTORY: Past Surgical History:  Procedure Laterality Date  . ABDOMINAL HYSTERECTOMY    . cataract surgery     right  . CESAREAN SECTION    . CHOLECYSTECTOMY    . EYE SURGERY    . PARTIAL HYSTERECTOMY     fibroid left ovary  . TONSILLECTOMY AND ADENOIDECTOMY      SOCIAL HISTORY: Social History   Socioeconomic History  . Marital status: Widowed    Spouse name: Not on file  . Number of children: 2  . Years of education: Not on file  . Highest education level: Not on file   Occupational History  . Not on file  Social Needs  . Financial resource strain: Not on file  . Food insecurity:    Worry: Not on file    Inability: Not on file  . Transportation needs:    Medical: Not on file    Non-medical: Not on file  Tobacco Use  . Smoking status: Never Smoker  . Smokeless tobacco: Never Used  Substance and Sexual Activity  . Alcohol use: No  . Drug use: No  . Sexual activity: Never  Lifestyle  . Physical activity:    Days per week: Not on file    Minutes per session: Not on file  . Stress: Not on file  Relationships  . Social connections:    Talks on phone: Not on file    Gets together: Not on file    Attends religious service: Not on file    Active member of club or organization: Not on file    Attends meetings of clubs or organizations: Not on file    Relationship status: Not on file  . Intimate partner violence:    Fear of current or ex partner: Not on file    Emotionally abused: Not on file    Physically abused: Not on file    Forced sexual activity: Not on file  Other Topics Concern  . Not on file  Social History Narrative  . Not on file    FAMILY HISTORY: Family  History  Problem Relation Age of Onset  . Diabetes Father   . Thyroid disease Sister   . Thyroid disease Brother   . Heart disease Mother   . Cancer Maternal Grandfather     ALLERGIES:  is allergic to latex.  MEDICATIONS:  Current Outpatient Medications  Medication Sig Dispense Refill  . Calcium Carbonate-Vitamin D (CALTRATE 600+D PO) Take by mouth 2 (two) times daily.     . cholecalciferol (VITAMIN D) 1000 UNITS tablet Take 1,000 Units by mouth daily.    Marland Kitchen ibuprofen (ADVIL) 200 MG tablet Take 200 mg by mouth every 6 (six) hours as needed for mild pain.     Marland Kitchen losartan (COZAAR) 50 MG tablet Take 1 tablet (50 mg total) by mouth daily. 90 tablet 3  . magnesium 30 MG tablet Take 30 mg by mouth 2 (two) times daily.    . Multiple Vitamin (MULTIVITAMIN) tablet Take 1 tablet  by mouth daily.     No current facility-administered medications for this visit.     REVIEW OF SYSTEMS:   Constitutional: Denies fevers, chills or abnormal night sweats Eyes: Denies blurriness of vision, double vision or watery eyes Ears, nose, mouth, throat, and face: Profound hearing impairment Respiratory: Denies cough, dyspnea or wheezes Cardiovascular: Denies palpitation, chest discomfort or lower extremity swelling Gastrointestinal:  Denies nausea, heartburn or change in bowel habits Skin: Denies abnormal skin rashes Lymphatics: Bilateral posterior cervical lymphadenopathy Neurological:Denies numbness, tingling or new weaknesses Behavioral/Psych: Mood is stable, no new changes  All other systems were reviewed with the patient and are negative.  PHYSICAL EXAMINATION: ECOG PERFORMANCE STATUS: 1 - Symptomatic but completely ambulatory  Vitals:   09/07/18 1311  BP: (!) 143/60  Pulse: 82  Resp: 16  Temp: 97.9 F (36.6 C)  SpO2: 98%   Filed Weights   09/07/18 1311  Weight: 102 lb 3.2 oz (46.4 kg)    GENERAL:alert, no distress and comfortable SKIN: skin color, texture, turgor are normal, no rashes or significant lesions EYES: normal, conjunctiva are pink and non-injected, sclera clear OROPHARYNX:no exudate, no erythema and lips, buccal mucosa, and tongue normal  NECK: Significant bilateral posterior cervical lymphadenopathy LYMPH: Bilateral cervical lymphadenopathy LUNGS: clear to auscultation and percussion with normal breathing effort HEART: regular rate & rhythm and no murmurs and no lower extremity edema ABDOMEN:abdomen soft, non-tender and normal bowel sounds Musculoskeletal:no cyanosis of digits and no clubbing  PSYCH: alert & oriented x 3 with fluent speech NEURO: no focal motor/sensory deficits  LABORATORY DATA:  I have reviewed the data as listed Lab Results  Component Value Date   WBC 3.9 08/17/2018   HGB 11.0 (L) 08/17/2018   HCT 34.5 08/17/2018   MCV  89 08/17/2018   PLT 114 (L) 08/17/2018   Lab Results  Component Value Date   NA 141 08/17/2018   K 4.2 08/17/2018   CL 102 08/17/2018   CO2 22 08/17/2018    RADIOGRAPHIC STUDIES: I have personally reviewed the radiological reports and agreed with the findings in the report.  ASSESSMENT AND PLAN:  Pancytopenia ([place]) Severe neutropenia with mild thrombocytopenia: ANC 200 and platelet count 114, hemoglobin 11 CT neck revealed small subcutaneous lymph nodes in the neck but extensive infiltrate right upper lobe, bilateral axillary and calcified mediastinal nodes suspicious for either carcinoid or lymphoma  Counseling: I discussed with the patient that the standard process for testing would require a bone marrow biopsy.  It will also require a lymph node excision.  However given her advanced  age, we discussed the risks and benefits of doing any such procedure which would be primarily for prognostic information.  She is unlikely to tolerate any significant systemic therapy for either lymphoma or metastatic disease or leukemia.  Patient's wishes are not to pursue any aggressive approaches including biopsies. We elected to perform the following blood tests. CBC to look at the peripheral smear, ANA to rule out lupus, flow cytometry to see if there are any lymphoma markers, B12 levels.  She will return back to see [place] in 1 week to discuss these results.  If there is no abnormalities identified then we will not pursue any further investigations and will refer her back to her primary care physician for supportive care only.  If it is truly leukemia or lymphoma then she is likely to progress fairly rapidly.  Patient tells me that she is the only one that we need to talk to regarding decision making.  She does have a guardian if she is not able to speak for herself.  She has this involved her son who apparently did not take care of her.  She has a caregiver who brings her to these appointments and has  been working for her for 21 years.  All questions were answered. The patient knows to call the clinic with any problems, questions or concerns.    Harriette Ohara, MD 09/07/18

## 2018-09-07 NOTE — Telephone Encounter (Signed)
Gave patient avs and calendar.   °

## 2018-09-08 LAB — ANTINUCLEAR ANTIBODIES, IFA: ANA Ab, IFA: NEGATIVE

## 2018-09-10 NOTE — Assessment & Plan Note (Signed)
Severe neutropenia with mild thrombocytopenia: ANC 200 and platelet count 114, hemoglobin 11 CT neck revealed small subcutaneous lymph nodes in the neck but extensive infiltrate right upper lobe, bilateral axillary and calcified mediastinal nodes  Peripheral Smear: atypical cells Flow Cytometry: AML 24% of cells in peripheral blood. I discussed with the patient the pathology report and that it is quite likely that over time, she will have more significant pancytopenia and high risk of infections and death.Over time, she will get severely fatigued. Because the prognosis is poor, I recommend hospice care for the patient. She has less than 6 months to live.  Patient agrees with the plan.

## 2018-09-11 ENCOUNTER — Inpatient Hospital Stay (HOSPITAL_BASED_OUTPATIENT_CLINIC_OR_DEPARTMENT_OTHER): Payer: Medicare Other | Admitting: Hematology and Oncology

## 2018-09-11 DIAGNOSIS — I10 Essential (primary) hypertension: Secondary | ICD-10-CM

## 2018-09-11 DIAGNOSIS — M810 Age-related osteoporosis without current pathological fracture: Secondary | ICD-10-CM

## 2018-09-11 DIAGNOSIS — K219 Gastro-esophageal reflux disease without esophagitis: Secondary | ICD-10-CM

## 2018-09-11 DIAGNOSIS — Z933 Colostomy status: Secondary | ICD-10-CM

## 2018-09-11 DIAGNOSIS — M5030 Other cervical disc degeneration, unspecified cervical region: Secondary | ICD-10-CM | POA: Diagnosis not present

## 2018-09-11 DIAGNOSIS — C9200 Acute myeloblastic leukemia, not having achieved remission: Secondary | ICD-10-CM | POA: Diagnosis not present

## 2018-09-11 DIAGNOSIS — I341 Nonrheumatic mitral (valve) prolapse: Secondary | ICD-10-CM

## 2018-09-11 DIAGNOSIS — Z79899 Other long term (current) drug therapy: Secondary | ICD-10-CM

## 2018-09-11 DIAGNOSIS — R590 Localized enlarged lymph nodes: Secondary | ICD-10-CM

## 2018-09-11 DIAGNOSIS — D709 Neutropenia, unspecified: Secondary | ICD-10-CM | POA: Diagnosis not present

## 2018-09-11 DIAGNOSIS — E079 Disorder of thyroid, unspecified: Secondary | ICD-10-CM

## 2018-09-11 LAB — FLOW CYTOMETRY

## 2018-09-11 NOTE — Progress Notes (Signed)
Patient Care Team: Shawnee Knapp, MD as PCP - General (Family Medicine) Lorretta Harp, MD (Cardiology) Danella Sensing, MD (Dermatology) Dian Queen, MD (Obstetrics and Gynecology) Calvert Cantor, MD (Ophthalmology)  DIAGNOSIS:  Encounter Diagnoses  Name Primary?  . Lymphadenopathy, cervical   . Acute myeloid leukemia not having achieved remission ([place])     SUMMARY OF ONCOLOGIC HISTORY:   Acute myeloid leukemia ([place])   09/08/2018 Initial Diagnosis    Acute myeloid leukemia ([place]): Peripheral blood flow cytometry revealed 24% blasts of AML     CHIEF COMPLIANT: Follow-up to discuss results of flow cytometry  INTERVAL HISTORY: Shelly Morse is a 84 year old extremely frail lady who was sent to me for neutropenia as well as lymphadenopathy.  Peripheral blood flow cytometry per was performed and it revealed 24% blasts of AML.  She has been brought in today to discuss the results of the testing and to discuss the prognosis.  She is extremely hard of hearing.  I was able to conference her power of attorney to listen into the discussion.  Patient is wheelchair-bound.  REVIEW OF SYSTEMS:   Constitutional: Severe generalized fatigue Eyes: Denies blurriness of vision Ears, nose, mouth, throat, and face: Denies mucositis or sore throat Respiratory: Denies cough, dyspnea or wheezes Cardiovascular: Denies palpitation, chest discomfort Gastrointestinal:  Denies nausea, heartburn or change in bowel habits Skin: Denies abnormal skin rashes Lymphatics: Denies new lymphadenopathy or easy bruising Neurological: Generalized weakness and hard of hearing Behavioral/Psych: Mood is stable, no new changes  Extremities: No lower extremity edema All other systems were reviewed with the patient and are negative.  I have reviewed the past medical history, past surgical history, social history and family history with the patient and they are unchanged from previous note.  ALLERGIES:  is allergic  to latex.  MEDICATIONS:  Current Outpatient Medications  Medication Sig Dispense Refill  . Calcium Carbonate-Vitamin D (CALTRATE 600+D PO) Take by mouth 2 (two) times daily.     . cholecalciferol (VITAMIN D) 1000 UNITS tablet Take 1,000 Units by mouth daily.    Marland Kitchen ibuprofen (ADVIL) 200 MG tablet Take 200 mg by mouth every 6 (six) hours as needed for mild pain.     Marland Kitchen losartan (COZAAR) 50 MG tablet Take 1 tablet (50 mg total) by mouth daily. 90 tablet 3  . magnesium 30 MG tablet Take 30 mg by mouth 2 (two) times daily.    . Multiple Vitamin (MULTIVITAMIN) tablet Take 1 tablet by mouth daily.     No current facility-administered medications for this visit.     PHYSICAL EXAMINATION: ECOG PERFORMANCE STATUS: 3 - Symptomatic, >50% confined to bed  Vitals:   09/11/18 1321  BP: 137/60  Pulse: 82  Resp: 17  Temp: 98 F (36.7 C)  SpO2: 95%   Filed Weights   09/11/18 1321  Weight: 102 lb (46.3 kg)    GENERAL:alert, no distress and comfortable SKIN: skin color, texture, turgor are normal, no rashes or significant lesions EYES: normal, Conjunctiva are pink and non-injected, sclera clear OROPHARYNX:no exudate, no erythema and lips, buccal mucosa, and tongue normal  NECK: supple, thyroid normal size, non-tender, without nodularity LYMPH:  no palpable lymphadenopathy in the cervical, axillary or inguinal LUNGS: clear to auscultation and percussion with normal breathing effort HEART: regular rate & rhythm and no murmurs and no lower extremity edema ABDOMEN:abdomen soft, non-tender and normal bowel sounds MUSCULOSKELETAL:no cyanosis of digits and no clubbing  NEURO: alert & oriented x 3 with fluent speech,  no focal motor/sensory deficits EXTREMITIES: No lower extremity edema   LABORATORY DATA:  I have reviewed the data as listed CMP Latest Ref Rng & Units 08/17/2018 07/30/2018 07/28/2017  Glucose 65 - 99 mg/dL 98 103(H) 80  BUN 10 - 36 mg/dL 16 18 19   Creatinine 0.57 - 1.00 mg/dL 0.90 0.74  0.93  Sodium 134 - 144 mmol/L 141 138 141  Potassium 3.5 - 5.2 mmol/L 4.2 3.2(L) 3.8  Chloride 96 - 106 mmol/L 102 105 102  CO2 20 - 29 mmol/L 22 26 26   Calcium 8.7 - 10.3 mg/dL 8.8 9.0 9.5  Total Protein 6.5 - 8.1 g/dL - 7.4 7.6  Total Bilirubin 0.3 - 1.2 mg/dL - 0.8 1.1  Alkaline Phos 38 - 126 U/L - 62 78  AST 15 - 41 U/L - 50(H) 25  ALT 0 - 44 U/L - 18 10    Lab Results  Component Value Date   WBC 5.7 09/07/2018   HGB 11.2 (L) 09/07/2018   HCT 34.6 (L) 09/07/2018   MCV 91.1 09/07/2018   PLT 92 (L) 09/07/2018   NEUTROABS 0.7 (L) 09/07/2018    ASSESSMENT & PLAN:  Lymphadenopathy, cervical Acute myeloid leukemia Severe neutropenia with mild thrombocytopenia: ANC 200 and platelet count 114, hemoglobin 11 CT neck revealed small subcutaneous lymph nodes in the neck but extensive infiltrate right upper lobe, bilateral axillary and calcified mediastinal nodes  Peripheral Smear: atypical cells Flow Cytometry: AML 24% of cells in peripheral blood. I discussed with the patient the pathology report and that it is quite likely that over time, she will have more significant pancytopenia and high risk of infections and death.Over time, she will get severely fatigued. Because the prognosis is poor, I recommend hospice care for the patient. She has less than 3 months to live.  Patient agrees with the plan. Patient's power of attorney was also on the conference call with me during this and all of their questions have been answered. I will consult hospice.    No orders of the defined types were placed in this encounter.  The patient has a good understanding of the overall plan. she agrees with it. she will call with any problems that may develop before the next visit here.   Harriette Ohara, MD 09/11/18

## 2018-09-12 ENCOUNTER — Telehealth: Payer: Self-pay | Admitting: *Deleted

## 2018-09-12 NOTE — Telephone Encounter (Signed)
Please see note below and make changes if possible

## 2018-09-12 NOTE — Telephone Encounter (Signed)
Per MD request - pt referred to [place].

## 2018-09-14 ENCOUNTER — Telehealth: Payer: Self-pay | Admitting: *Deleted

## 2018-09-14 NOTE — Telephone Encounter (Signed)
This RN received VM from [employer] " this is Safia's niece and POA " .  Per message Jenny Reichmann stated concern due to continued decline in pt " she is unable to walk, she is not eating and is confused "  " I need a letter from Dr Lindi Adie stating that Arlenne is now incompetent so I can make the decisions for her best care "  Pt left return call number as [phone].  This RN called [facility] and obtained pt's assigned nurse and her contact number Joline Maxcy at [phone].  Attempted to contact Tonya and obtained identified VM - message left requesting return call to this RN.  This RN also requested to speak with SW per above and was transferred to the VM for [facility].  This RN called back to speak directly with a SW per concern of urgency stating by [place].  This RN spoke with Abigail Butts- informed her of above stated concerns by Jenny Reichmann as well as need for assessment of competency for MD to sign or declare pt incompetent for POA to be invoked.  Abigail Butts states they have also received a call from [place] with noted concerns.  Discussed as well concern of stated symptoms showing rapid decline and possible need for bed at [facility].  Abigail Butts stated Kenney Houseman and Dyann Kief are " on their way now to see the patient ".  Abigail Butts will contact above to get recent update and concern per this call.

## 2018-09-18 ENCOUNTER — Ambulatory Visit: Payer: Medicare Other | Admitting: Hematology and Oncology

## 2018-09-18 ENCOUNTER — Encounter: Payer: Medicare Other | Admitting: Family Medicine

## 2018-09-25 ENCOUNTER — Ambulatory Visit: Payer: Medicare Other | Admitting: Internal Medicine

## 2018-09-25 NOTE — Progress Notes (Deleted)
IOV 09/20/2014  Chief Complaint  Patient presents with  . Pulmonary Consult    Pt referred by Dr. Everlene Morse for abnormal CT.    Near 84 year old female very sharp cognitively and kind of frail. Refer to primary care physician for abnormal chest CT in the context of hemoptysis. She is a passive smoker for 55 years. She is currently independent and drives. Her son lives next door. 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 Apparently these books have been dusty and she has been exposed to this dust along with glue  chemicals and possibly mold. Then a few months ago she developed some cough with mucus production and sinus drainage. Done 08/19/2014 chin and episode of hemoptysis and went to the emergency room. Apparently she was not given antibiotics. Chest x-ray showed emphysema and pulmonary fibrosis chronic findings and therefore she was referred back to the primary care physician. She saw Dr. Everlene Morse on 08/27/2014 and a CT scan of the chest was ordered. CT scan of the chest done 09/03/2014 showed only tree in bud nodularity in the lingula along with some bronchiectasis.   - She is extremely concerned that she has recurrence of mycobacterium tuberculosis. Apparently in the 1960s she was exposed to her uncle with mycobacterium tuberculosis and since then she is being positive PPD. She denies being treated for latent tuberculosis and she is upset that physicians have not offered her this option  - She is also upset that the chest x-ray showed chronic findings and she believes that the health care system has deliberately took another dog about chronic lung disease findings. It is to be noted that on the chest x-ray shows chronic findings of the CT scan does not show emphysema pulmonary fibrosis  - In any event her cough has settled and  almost resolved especially after she stop dusting the books    02/25/2015 6 month follow up : Bronchiectasis w/ tree in bud infiltrates left lung lingula  Pt returns for a 6 month follow up .  See previously for abn CT chest with bronchietasis possible MAI. With cough and hemoptysis  Hemoptysis resoved. Cough improved . Workup showed :  Quantiferon gold test neg  Sputum for AFB neg. .  Since last ov doing well, remains independent . She has had no hemoptyiss . Marland Kitchen Has occasional cough with clear mucus. No abx use since last ov.  No ER or hospital visits.   OV 11/07/2015  Chief Complaint  Patient presents with  . Follow-up    Pt states her breathing is unchanged since last OV. Pt states her cough with clear mucus is at baseline. Pt denies CP/tightness.    Follow-up bronchiectasis with tree-in-bud infiltrates in the lingula.  Last seen March 2016 At that time she had a CT scan of the chest which shows some progression in her infiltrates. QuantiFERON gold test was negative. Sputum for AFB was negative. Since then she's not having any hemoptysis. She not having cough. She is essentially asymptomatic. Remains independent. She was able to drive to work which she is always careful not to fall. She has some consternation about visiting our office. She says that architecturally this office is not designed to panel 84 year old people. Overall she's asymptomatic. She is up-to-date with her flu shot   OV 05/05/2016  Chief  Complaint  Patient presents with  . Follow-up    pt states breathing is baseline since last OV. c/o mucus in throat. denies SOB, cough, wheezing or cp/tightness.   Follow-up bronchiectasis with tree-in-bud infiltrates in the lingula  The routine follow-up. She continues to do well. Essentially asymptomatic from a respiratory standpoint. This time she admitted she has postnasal drip that irritates her throat and she has to clear it. Ideally she likes some relief for this.     OV  09/25/2018  Subjective:  Patient ID: Shelly Morse, female , DOB: 25-Mar-1922 , age 61 y.o. , MRN: [id number] , ADDRESS: [address] [place] [address]   09/25/2018 -  No chief complaint on file.    HPI Shelly Morse 84 y.o. -     ROS - per HPI     has a past medical history of Bronchiectasis ([place]), Cataract, Cervical disc disease, GERD (gastroesophageal reflux disease), Hypertension, MVP (mitral valve prolapse), Osteoporosis, Positive PPD, Pyelonephritis (06/2011), and Thyroid disease.   reports that she has never smoked. She has never used smokeless tobacco.  Past Surgical History:  Procedure Laterality Date  . ABDOMINAL HYSTERECTOMY    . cataract surgery     right  . CESAREAN SECTION    . CHOLECYSTECTOMY    . EYE SURGERY    . PARTIAL HYSTERECTOMY     fibroid left ovary  . TONSILLECTOMY AND ADENOIDECTOMY      Allergies  Allergen Reactions  . Latex Itching    Immunization History  Administered Date(s) Administered  . Influenza,inj,Quad PF,6+ Mos 09/21/2015, 09/13/2016  . Pneumococcal-Unspecified 12/28/1991  . Td 09/26/1998    Family History  Problem Relation Age of Onset  . Diabetes Father   . Thyroid disease Sister   . Thyroid disease Brother   . Heart disease Mother   . Cancer Maternal Grandfather      Current Outpatient Medications:  .  Calcium Carbonate-Vitamin D (CALTRATE 600+D PO), Take by mouth 2 (two) times daily. , Disp: , Rfl:  .  cholecalciferol (VITAMIN D) 1000 UNITS tablet, Take 1,000 Units by mouth daily., Disp: , Rfl:  .  ibuprofen (ADVIL) 200 MG tablet, Take 200 mg by mouth every 6 (six) hours as needed for mild pain. , Disp: , Rfl:  .  losartan (COZAAR) 50 MG tablet, Take 1 tablet (50 mg total) by mouth daily., Disp: 90 tablet, Rfl: 3 .  magnesium 30 MG tablet, Take 30 mg by mouth 2 (two) times daily., Disp: , Rfl:  .  Multiple Vitamin (MULTIVITAMIN) tablet, Take 1 tablet by mouth daily., Disp: , Rfl:       Objective:     There were no vitals filed for this visit.  Estimated body mass index is 18.07 kg/m as calculated from the following:   Height as of 09/11/18: 5' 2.99" (1.6 m).   Weight as of 09/11/18: 102 lb (46.3 kg).  @WEIGHTCHANGE @  There were no vitals filed for this visit.   Physical Exam  General Appearance:    Alert, cooperative, no distress, appears stated age - *** , Deconditioned looking - *** , OBESE  - ***, Sitting on Wheelchair -  ***  Head:    Normocephalic, without obvious abnormality, atraumatic  Eyes:    PERRL, conjunctiva/corneas clear,  Ears:    Normal TM's and external ear canals, both ears  Nose:   Nares normal, septum midline, mucosa normal, no drainage    or sinus tenderness. OXYGEN ON  - *** .  Patient is @ ***   Throat:   Lips, mucosa, and tongue normal; teeth and gums normal. Cyanosis on lips - ***  Neck:   Supple, symmetrical, trachea midline, no adenopathy;    thyroid:  no enlargement/tenderness/nodules; no carotid   bruit or JVD  Back:     Symmetric, no curvature, ROM normal, no CVA tenderness  Lungs:     Distress - *** , Wheeze ***, Barrell Chest - ***, Purse lip breathing - ***, Crackles - ***   Chest Wall:    No tenderness or deformity.    Heart:    Regular rate and rhythm, S1 and S2 normal, no rub   or gallop, Murmur - ***  Breast Exam:    NOT DONE  Abdomen:     Soft, non-tender, bowel sounds active all four quadrants,    no masses, no organomegaly. Visceral obesity - ***  Genitalia:   NOT DONE  Rectal:   NOT DONE  Extremities:   Extremities - normal, Has Cane - ***, Clubbing - ***, Edema - ***  Pulses:   2+ and symmetric all extremities  Skin:   Stigmata of Connective Tissue Disease - ***  Lymph nodes:   Cervical, supraclavicular, and axillary nodes normal  Psychiatric:  Neurologic:   Pleasant - ***, Anxious - ***, Flat affect - ***  CAm-ICU - neg, Alert and Oriented x 3 - yes, Moves all 4s - yes, Speech - normal, Cognition - intact            Assessment:     No diagnosis found.     Plan:     There are no Patient Instructions on file for this visit.    SIGNATURE    Dr. Brand Males, M.D., F.C.C.P,  Pulmonary and Critical Care Medicine Staff Physician, [place] Director - Interstitial Lung Disease  Program  [facility] [place], [place], [address]  Pager: [phone], If no answer or between  15:00h - 7:00h: call [phone] Telephone: [phone]  12:24 AM 09/25/2018

## 2018-09-26 ENCOUNTER — Telehealth: Payer: Self-pay | Admitting: Family Medicine

## 2018-09-26 NOTE — Telephone Encounter (Signed)
Called and spoke with pt regarding Dr. Brigitte Pulse being on leave for October and her CPE was scheduled for 10/28. Pt stated that she was unable to get out of bed right now and would call and reschedule later if need be.

## 2018-09-28 NOTE — Telephone Encounter (Signed)
This is already past and ct was not done. Pt saw Dr. Lindi Adie in f/u for this issue and determined not needed - was referred to hospice

## 2018-10-14 ENCOUNTER — Other Ambulatory Visit: Payer: Self-pay | Admitting: Family Medicine

## 2018-10-16 NOTE — Telephone Encounter (Signed)
Requested Prescriptions  Pending Prescriptions Disp Refills  . losartan (COZAAR) 50 MG tablet [Pharmacy Med Name: LOSARTAN POTASSIUM 50 MG TAB] 90 tablet 3    Sig: TAKE 1 TABLET BY MOUTH EVERY DAY     Cardiovascular:  Angiotensin Receptor Blockers Passed - 10/14/2018  1:04 AM      Passed - Cr in normal range and within 180 days    Creat  Date Value Ref Range Status  09/21/2015 0.87 0.60 - 0.88 mg/dL Final   Creatinine, Ser  Date Value Ref Range Status  08/17/2018 0.90 0.57 - 1.00 mg/dL Final         Passed - K in normal range and within 180 days    Potassium  Date Value Ref Range Status  08/17/2018 4.2 3.5 - 5.2 mmol/L Final         Passed - Patient is not pregnant      Passed - Last BP in normal range    BP Readings from Last 1 Encounters:  09/11/18 137/60         Passed - Valid encounter within last 6 months    Recent Outpatient Visits          2 months ago Lymphadenopathy, cervical   [facility], Laurey Arrow, MD   5 months ago Herpes zoster without complication   [facility], [place] D, PA-C   1 year ago Medicare annual wellness visit, subsequent   [facility], Laurey Arrow, MD   1 year ago 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

## 2018-10-23 ENCOUNTER — Encounter: Payer: Medicare Other | Admitting: Family Medicine

## 2018-10-27 DEATH — deceased
